# Patient Record
Sex: Male | Born: 1965 | Race: White | Hispanic: No | Marital: Married | Smoking: Current every day smoker
Health system: Southern US, Community
[De-identification: ages and names within clinical notes are randomized; demographics above are authoritative.]

## PROBLEM LIST (undated history)

## (undated) DIAGNOSIS — K219 Gastro-esophageal reflux disease without esophagitis: Secondary | ICD-10-CM

## (undated) DIAGNOSIS — E785 Hyperlipidemia, unspecified: Secondary | ICD-10-CM

## (undated) DIAGNOSIS — G4730 Sleep apnea, unspecified: Secondary | ICD-10-CM

## (undated) DIAGNOSIS — G6181 Chronic inflammatory demyelinating polyneuritis: Secondary | ICD-10-CM

## (undated) DIAGNOSIS — F419 Anxiety disorder, unspecified: Secondary | ICD-10-CM

## (undated) DIAGNOSIS — J45909 Unspecified asthma, uncomplicated: Secondary | ICD-10-CM

## (undated) DIAGNOSIS — R269 Unspecified abnormalities of gait and mobility: Secondary | ICD-10-CM

## (undated) DIAGNOSIS — F329 Major depressive disorder, single episode, unspecified: Secondary | ICD-10-CM

## (undated) DIAGNOSIS — F32A Depression, unspecified: Secondary | ICD-10-CM

## (undated) DIAGNOSIS — R2689 Other abnormalities of gait and mobility: Secondary | ICD-10-CM

## (undated) HISTORY — DX: Unspecified abnormalities of gait and mobility: R26.9

## (undated) HISTORY — DX: Anxiety disorder, unspecified: F41.9

## (undated) HISTORY — DX: Major depressive disorder, single episode, unspecified: F32.9

## (undated) HISTORY — DX: Other abnormalities of gait and mobility: R26.89

## (undated) HISTORY — DX: Chronic inflammatory demyelinating polyneuritis: G61.81

## (undated) HISTORY — DX: Hyperlipidemia, unspecified: E78.5

## (undated) HISTORY — DX: Depression, unspecified: F32.A

## (undated) HISTORY — DX: Unspecified asthma, uncomplicated: J45.909

## (undated) HISTORY — PX: FRACTURE SURGERY: SHX138

## (undated) HISTORY — DX: Sleep apnea, unspecified: G47.30

## (undated) HISTORY — DX: Gastro-esophageal reflux disease without esophagitis: K21.9

---

## 2017-07-27 ENCOUNTER — Ambulatory Visit: Payer: 59 | Admitting: Family Medicine

## 2017-07-27 ENCOUNTER — Encounter: Payer: Self-pay | Admitting: Family Medicine

## 2017-07-27 VITALS — BP 136/84 | HR 74 | Temp 98.4°F | Ht 70.0 in | Wt 181.0 lb

## 2017-07-27 DIAGNOSIS — I10 Essential (primary) hypertension: Secondary | ICD-10-CM | POA: Diagnosis not present

## 2017-07-27 DIAGNOSIS — K219 Gastro-esophageal reflux disease without esophagitis: Secondary | ICD-10-CM

## 2017-07-27 DIAGNOSIS — Z23 Encounter for immunization: Secondary | ICD-10-CM

## 2017-07-27 DIAGNOSIS — F339 Major depressive disorder, recurrent, unspecified: Secondary | ICD-10-CM | POA: Diagnosis not present

## 2017-07-27 DIAGNOSIS — E785 Hyperlipidemia, unspecified: Secondary | ICD-10-CM | POA: Diagnosis not present

## 2017-07-27 DIAGNOSIS — F17200 Nicotine dependence, unspecified, uncomplicated: Secondary | ICD-10-CM

## 2017-07-27 DIAGNOSIS — F17210 Nicotine dependence, cigarettes, uncomplicated: Secondary | ICD-10-CM | POA: Insufficient documentation

## 2017-07-27 DIAGNOSIS — I209 Angina pectoris, unspecified: Secondary | ICD-10-CM | POA: Diagnosis not present

## 2017-07-27 MED ORDER — ATORVASTATIN CALCIUM 20 MG PO TABS: 20.0000 mg | ORAL_TABLET | Freq: Every day | ORAL | 5 refills | Status: DC

## 2017-07-27 MED ORDER — NAPROXEN 500 MG PO TABS: 500.0000 mg | ORAL_TABLET | Freq: Two times a day (BID) | ORAL | 2 refills | Status: DC

## 2017-07-27 MED ORDER — HYDROCHLOROTHIAZIDE 25 MG PO TABS: 25.0000 mg | ORAL_TABLET | Freq: Every day | ORAL | 5 refills | Status: DC

## 2017-07-27 MED ORDER — OMEPRAZOLE 20 MG PO CPDR: 20.0000 mg | DELAYED_RELEASE_CAPSULE | Freq: Every day | ORAL | 11 refills | Status: DC

## 2017-07-27 MED ORDER — CETIRIZINE HCL 10 MG PO TABS: 10.0000 mg | ORAL_TABLET | Freq: Every day | ORAL | 11 refills | Status: DC

## 2017-07-27 MED ORDER — FLUTICASONE PROPIONATE 50 MCG/ACT NA SUSP: 1.0000 | Freq: Two times a day (BID) | NASAL | 6 refills | Status: AC | PRN
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
## 2017-07-28 LAB — CMP14+EGFR
A/G RATIO: 1.4 (ref 1.2–2.2)
ALT: 16 IU/L (ref 0–44)
AST: 17 IU/L (ref 0–40)
Albumin: 3.8 g/dL (ref 3.5–5.5)
Alkaline Phosphatase: 90 IU/L (ref 39–117)
BILIRUBIN TOTAL: 0.2 mg/dL (ref 0.0–1.2)
BUN / CREAT RATIO: 10 (ref 9–20)
BUN: 11 mg/dL (ref 6–24)
CHLORIDE: 99 mmol/L (ref 96–106)
CO2: 26 mmol/L (ref 20–29)
Calcium: 9.1 mg/dL (ref 8.7–10.2)
Creatinine, Ser: 1.11 mg/dL (ref 0.76–1.27)
GFR calc Af Amer: 87 mL/min/{1.73_m2} (ref >59)
GFR, EST NON AFRICAN AMERICAN: 75 mL/min/{1.73_m2} (ref >59)
Globulin, Total: 2.7 g/dL (ref 1.5–4.5)
Glucose: 79 mg/dL (ref 65–99)
POTASSIUM: 4.5 mmol/L (ref 3.5–5.2)
Sodium: 138 mmol/L (ref 134–144)
TOTAL PROTEIN: 6.5 g/dL (ref 6.0–8.5)

## 2017-07-28 LAB — CBC WITH DIFFERENTIAL/PLATELET
BASOS: 0 %
Basophils Absolute: 0 10*3/uL (ref 0.0–0.2)
EOS (ABSOLUTE): 0.4 10*3/uL (ref 0.0–0.4)
Eos: 5 %
HEMOGLOBIN: 12.9 g/dL — AB (ref 13.0–17.7)
Hematocrit: 38 % (ref 37.5–51.0)
IMMATURE GRANS (ABS): 0 10*3/uL (ref 0.0–0.1)
Immature Granulocytes: 0 %
LYMPHS: 20 %
Lymphocytes Absolute: 1.5 10*3/uL (ref 0.7–3.1)
MCH: 30.9 pg (ref 26.6–33.0)
MCHC: 33.9 g/dL (ref 31.5–35.7)
MCV: 91 fL (ref 79–97)
MONOCYTES: 4 %
Monocytes Absolute: 0.3 10*3/uL (ref 0.1–0.9)
NEUTROS ABS: 5.3 10*3/uL (ref 1.4–7.0)
Neutrophils: 71 %
Platelets: 311 10*3/uL (ref 150–379)
RBC: 4.18 x10E6/uL (ref 4.14–5.80)
RDW: 14.1 % (ref 12.3–15.4)
WBC: 7.5 10*3/uL (ref 3.4–10.8)

## 2017-07-28 LAB — LIPID PANEL
CHOL/HDL RATIO: 3.7 ratio (ref 0.0–5.0)
Cholesterol, Total: 119 mg/dL (ref 100–199)
HDL: 32 mg/dL — ABNORMAL LOW (ref >39)
LDL Calculated: 68 mg/dL (ref 0–99)
Triglycerides: 93 mg/dL (ref 0–149)
VLDL CHOLESTEROL CAL: 19 mg/dL (ref 5–40)

## 2017-09-07 ENCOUNTER — Other Ambulatory Visit: Payer: Self-pay | Admitting: *Deleted

## 2017-09-07 MED ORDER — VERAPAMIL HCL ER 240 MG PO CP24: 240.0000 mg | ORAL_CAPSULE | Freq: Every morning | ORAL | 0 refills | Status: DC

## 2017-09-12 ENCOUNTER — Encounter: Payer: Self-pay | Admitting: Cardiology
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
## 2017-09-14 ENCOUNTER — Ambulatory Visit: Payer: 59 | Admitting: Cardiology

## 2017-09-14 ENCOUNTER — Encounter: Payer: Self-pay | Admitting: Cardiology

## 2017-09-14 VITALS — BP 123/82 | HR 75 | Ht 70.0 in | Wt 177.2 lb

## 2017-09-14 DIAGNOSIS — Z720 Tobacco use: Secondary | ICD-10-CM | POA: Diagnosis not present

## 2017-09-14 DIAGNOSIS — E785 Hyperlipidemia, unspecified: Secondary | ICD-10-CM

## 2017-09-14 DIAGNOSIS — R072 Precordial pain: Secondary | ICD-10-CM

## 2017-09-14 MED ORDER — NITROGLYCERIN 0.4 MG SL SUBL: 0.4000 mg | SUBLINGUAL_TABLET | SUBLINGUAL | 2 refills | Status: DC | PRN

## 2017-09-14 NOTE — Patient Instructions (Signed)
Medication Instructions:  Continue current medications  If you need a refill on your cardiac medications before your next appointment, please call your pharmacy.  Labwork: None Ordered   Testing/Procedures: Your physician has requested that you have an exercise tolerance test. For further information please visit [email or web address]. Please also follow instruction sheet, as given.   Follow-Up: Your physician wants you to follow-up in: As Needed.     Thank you for choosing [facility]!!       

## 2017-09-30 ENCOUNTER — Telehealth (HOSPITAL_COMMUNITY): Payer: Self-pay

## 2017-09-30 NOTE — Telephone Encounter (Signed)
Encounter complete. 

## 2017-10-01 ENCOUNTER — Ambulatory Visit (HOSPITAL_COMMUNITY): Admission: RE | Admit: 2017-10-01 | Payer: 59 | Source: Ambulatory Visit | Attending: Cardiology | Admitting: Cardiology

## 2017-10-02 ENCOUNTER — Other Ambulatory Visit: Payer: Self-pay | Admitting: Family Medicine

## 2017-10-02 MED ORDER — ESCITALOPRAM OXALATE 10 MG PO TABS: 10.0000 mg | ORAL_TABLET | Freq: Every day | ORAL | 1 refills | Status: DC

## 2017-10-02 NOTE — Telephone Encounter (Signed)
Pt notified of refill Okay per Dr Louanne Skyeettinger

## 2017-10-05 ENCOUNTER — Ambulatory Visit: Payer: 59 | Admitting: Family Medicine

## 2017-10-05 ENCOUNTER — Encounter: Payer: Self-pay | Admitting: Family Medicine

## 2017-10-05 VITALS — BP 122/77 | HR 78 | Temp 97.5°F | Ht 70.0 in | Wt 175.0 lb

## 2017-10-05 DIAGNOSIS — R399 Unspecified symptoms and signs involving the genitourinary system: Secondary | ICD-10-CM

## 2017-10-05 DIAGNOSIS — R319 Hematuria, unspecified: Secondary | ICD-10-CM

## 2017-10-05 LAB — MICROSCOPIC EXAMINATION
RBC, UA: NONE SEEN /hpf (ref 0–2)
RENAL EPITHEL UA: NONE SEEN /HPF
WBC, UA: NONE SEEN /hpf (ref 0–5)

## 2017-10-05 LAB — URINALYSIS, COMPLETE
Bilirubin, UA: NEGATIVE
Glucose, UA: NEGATIVE
Ketones, UA: NEGATIVE
LEUKOCYTES UA: NEGATIVE
Nitrite, UA: NEGATIVE
PH UA: 5.5 (ref 5.0–7.5)
PROTEIN UA: NEGATIVE
RBC, UA: NEGATIVE
Specific Gravity, UA: 1.025 (ref 1.005–1.030)
Urobilinogen, Ur: 1 mg/dL (ref 0.2–1.0)

## 2017-10-05 MED ORDER — CEPHALEXIN 500 MG PO CAPS: 500.0000 mg | ORAL_CAPSULE | Freq: Four times a day (QID) | ORAL | 0 refills | Status: DC

## 2017-10-05 NOTE — Progress Notes (Signed)
BP 122/77   Pulse 78   Temp (!) 97.5 F (36.4 C) (Oral)   Ht 5\' 10"  (1.778 m)   Wt 175 lb (79.4 kg)   BMI 25.11 kg/m    Subjective:    Patient ID: Anthony Booth, male    DOB: 04/28/1965, 52 y.o.   MRN: [id number]  HPI: Anthony Booth is a 52 y.o. male presenting on 10/05/2017 for Hematuria (noticed once this weekend)   HPI Hematuria and dysuria Patient is coming with complaints of hematuria and dysuria that he noticed this weekend.  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  He does admit that he does not drink enough fluids and does have some darker urine all the time but has not noticed blood before that time.  Since then he is only noticed a couple drops coming out one other time but he has had burning and dysuria and pain with urination over this whole weekend including 2 today.  He denies any flank pain or abdominal pain or fevers or chills.  Relevant past medical, surgical, family and social history reviewed and updated as indicated. Interim medical history since our last visit reviewed. Allergies and medications reviewed and updated.  Review of Systems  Constitutional: Negative for chills and fever.  Respiratory: Negative for shortness of breath and wheezing.   Cardiovascular: Negative for chest pain and leg swelling.  Gastrointestinal: Negative for abdominal pain.  Genitourinary: Positive for dysuria, hematuria and urgency. Negative for difficulty urinating, flank pain, frequency, genital sores, penile pain, penile swelling, scrotal swelling and testicular pain.  Musculoskeletal: Negative for back pain and gait problem.  Skin: Negative for rash.  All other systems reviewed and are negative.   Per HPI unless specifically indicated above   Allergies as of 10/05/2017      Reactions   Penicillins Nausea Only    Lovenox [enoxaparin Sodium] Anxiety      Medication List        Accurate as of 10/05/17  4:38 PM. Always use your most recent med list.          albuterol 108 (90 Base) MCG/ACT inhaler Commonly known as:  PROVENTIL HFA;VENTOLIN HFA Inhale 2 puffs into the lungs every 6 (six) hours as needed for wheezing or shortness of breath.   aspirin EC 81 MG tablet Take 81 mg by mouth daily.   atorvastatin 20 MG tablet Commonly known as:  LIPITOR Take 1 tablet (20 mg total) by mouth daily.   baclofen 10 MG tablet Commonly known as:  LIORESAL Take 10 mg by mouth 2 (two) times daily.   cephALEXin 500 MG capsule Commonly known as:  KEFLEX Take 1 capsule (500 mg total) by mouth 4 (four) times daily.   cetirizine 10 MG tablet Commonly known as:  ZYRTEC Take 1 tablet (10 mg total) by mouth daily.   escitalopram 10 MG tablet Commonly known as:  LEXAPRO Take 1 tablet (10 mg total) by mouth daily.   fluticasone 50 MCG/ACT nasal spray Commonly known as:  FLONASE Place 1 spray into both nostrils 2 (two) times daily as needed for allergies or rhinitis.   hydrochlorothiazide 25 MG tablet Commonly known as:  HYDRODIURIL Take 1 tablet (25 mg total) by mouth daily.   naproxen 500 MG tablet Commonly known as:  NAPROSYN Take 1  tablet (500 mg total) by mouth 2 (two) times daily with a meal.   nitroGLYCERIN 0.4 MG SL tablet Commonly known as:  NITROSTAT Place 1 tablet (0.4 mg total) under the tongue every 5 (five) minutes as needed for chest pain.   omeprazole 20 MG capsule Commonly known as:  PRILOSEC Take 1 capsule (20 mg total) by mouth daily.   verapamil 240 MG 24 hr capsule Commonly known as:  VERELAN PM Take 1 capsule (240 mg total) by mouth every morning.   vitamin C 500 MG tablet Commonly known as:  ASCORBIC ACID Take 500 mg by mouth daily.   Vitamin D3 5000 units Tabs Take 5,000 Units by mouth daily.          Objective:    BP 122/77   Pulse 78   Temp (!) 97.5 F  (36.4 C) (Oral)   Ht 5\' 10"  (1.778 m)   Wt 175 lb (79.4 kg)   BMI 25.11 kg/m   Wt Readings from Last 3 Encounters:  10/05/17 175 lb (79.4 kg)  09/14/17 177 lb 3.2 oz (80.4 kg)  07/27/17 181 lb (82.1 kg)    Physical Exam  Constitutional: He is oriented to person, place, and time. He appears well-developed and well-nourished. No distress.  Eyes: Conjunctivae are normal. No scleral icterus.  Cardiovascular: Normal rate, regular rhythm, normal heart sounds and intact distal pulses.  No murmur heard. Pulmonary/Chest: Effort normal and breath sounds normal. No respiratory distress. He has no wheezes.  Abdominal: Soft. Bowel sounds are normal. He exhibits no distension and no mass. There is no tenderness. There is no rigidity, no rebound, no guarding and no CVA tenderness.  Neurological: He is alert and oriented to person, place, and time. Coordination normal.  Skin: Skin is warm and dry. No rash noted. He is not diaphoretic.  Psychiatric: He has a normal mood and affect. His behavior is normal.  Nursing note and vitals reviewed.  Urinalysis: 0-10 epithelial cells, few bacteria, otherwise negative     Assessment & Plan:   Problem List Items Addressed This Visit    None    Visit Diagnoses    UTI symptoms    -  Primary   Relevant Medications   cephALEXin (KEFLEX) 500 MG capsule   Hematuria, unspecified type       Relevant Medications   cephALEXin (KEFLEX) 500 MG capsule   Other Relevant Orders   Urinalysis, Complete       Follow up plan: Return if symptoms worsen or fail to improve.  Counseling provided for all of the vaccine components Orders Placed This Encounter  Procedures  . Urinalysis, Complete    Arville CareJoshua Dettinger, MD [facility] 10/05/2017, 4:38 PM

## 2017-12-08 ENCOUNTER — Other Ambulatory Visit: Payer: Self-pay | Admitting: *Deleted

## 2017-12-08 MED ORDER — BACLOFEN 10 MG PO TABS: 10.0000 mg | ORAL_TABLET | Freq: Two times a day (BID) | ORAL | 1 refills | Status: DC

## 2017-12-08 NOTE — Telephone Encounter (Signed)
Pt requesting RF on Baclofen for his pain - shoulder, chest Rxd by previous provider, not filled here yet First OV 07/27/17. Next OV 01/27/18 [facility] [place] Please advise

## 2017-12-12 ENCOUNTER — Other Ambulatory Visit: Payer: Self-pay | Admitting: Family Medicine

## 2018-01-27 ENCOUNTER — Ambulatory Visit: Payer: 59 | Admitting: Family Medicine

## 2018-01-27 ENCOUNTER — Encounter: Payer: Self-pay | Admitting: Family Medicine

## 2018-01-27 MED ORDER — NAPROXEN 500 MG PO TABS: 500.0000 mg | ORAL_TABLET | Freq: Two times a day (BID) | ORAL | 2 refills | Status: DC

## 2018-01-27 MED ORDER — VERAPAMIL HCL ER 240 MG PO CP24: ORAL_CAPSULE | ORAL | 3 refills | Status: AC

## 2018-01-27 MED ORDER — BACLOFEN 10 MG PO TABS: 10.0000 mg | ORAL_TABLET | Freq: Two times a day (BID) | ORAL | 1 refills | Status: DC

## 2018-01-27 MED ORDER — ATORVASTATIN CALCIUM 20 MG PO TABS: 20.0000 mg | ORAL_TABLET | Freq: Every day | ORAL | 3 refills | Status: DC

## 2018-01-27 MED ORDER — HYDROCHLOROTHIAZIDE 25 MG PO TABS: 25.0000 mg | ORAL_TABLET | Freq: Every day | ORAL | 3 refills | Status: DC

## 2018-01-27 NOTE — Progress Notes (Signed)
Erroneous visit because patient had to leave with spouse to go to the emergency department

## 2018-03-08 ENCOUNTER — Encounter: Payer: Self-pay | Admitting: Family Medicine

## 2018-03-08 ENCOUNTER — Ambulatory Visit (INDEPENDENT_AMBULATORY_CARE_PROVIDER_SITE_OTHER): Payer: 59 | Admitting: Family Medicine

## 2018-03-08 VITALS — BP 118/70 | HR 84 | Temp 98.3°F | Ht 70.0 in | Wt 168.2 lb

## 2018-03-08 DIAGNOSIS — L858 Other specified epidermal thickening: Secondary | ICD-10-CM | POA: Diagnosis not present

## 2018-03-08 DIAGNOSIS — K219 Gastro-esophageal reflux disease without esophagitis: Secondary | ICD-10-CM | POA: Diagnosis not present

## 2018-03-08 DIAGNOSIS — E785 Hyperlipidemia, unspecified: Secondary | ICD-10-CM

## 2018-03-08 DIAGNOSIS — I10 Essential (primary) hypertension: Secondary | ICD-10-CM | POA: Diagnosis not present

## 2018-03-08 NOTE — Progress Notes (Signed)
BP 118/70   Pulse 84   Temp 98.3 F (36.8 C) (Oral)   Ht _0  (1.778 m)   Wt 168 lb 3.2 oz (76.3 kg)   BMI 24.13 kg/m    Subjective:    Patient ID: Anthony Booth, male    DOB: 1965-10-17, 52 y.o.   MRN: [id number]  HPI: Anthony Booth is a 52 y.o. male presenting on 03/08/2018 for Hyperlipidemia (6 month follow up) and Hypertension   HPI Hypertension Patient is currently on verapamil and hydrochlorothiazide, and their blood pressure today is 118/70. Patient denies any lightheadedness or dizziness. Patient denies headaches, blurred vision, chest pains, shortness of breath, or weakness. Denies any side effects from medication and is content with current medication.   Hyperlipidemia Patient is coming in for recheck of his hyperlipidemia. The patient is currently taking atorvastatin. They deny any issues with myalgias or history of liver damage from it. They deny any focal numbness or weakness or chest pain.   GERD Patient is currently on omeprazole.  She denies any major symptoms or abdominal pain or belching or burping. She denies any blood in her stool or lightheadedness or dizziness.   Skin lesion/horn Patient has a skin lesion that is noticed over the past 6 months that sticks out like a horn on his right chest and it really bothers him because of the way that it is pointed in he picks himself sometimes on it because it is sharp and hard and is like a horn.  Relevant past medical, surgical, family and social history reviewed and updated as indicated. Interim medical history since our last visit reviewed. Allergies and medications reviewed and updated.  Review of Systems  Constitutional: Negative for chills and fever.  Eyes: Negative for visual disturbance.  Respiratory: Negative for shortness of breath and wheezing.   Cardiovascular: Negative for chest pain and leg swelling.  Musculoskeletal: Negative for back pain and gait problem.  Skin: Negative for rash.   Horn on chest  Neurological: Negative for dizziness, weakness and numbness.  All other systems reviewed and are negative.   Per HPI unless specifically indicated above   Allergies as of 03/08/2018      Reactions   Penicillins Nausea Only   Lovenox [enoxaparin Sodium] Anxiety      Medication List       Accurate as of March 08, 2018  9:03 AM. Always use your most recent med list.        albuterol 108 (90 Base) MCG/ACT inhaler Commonly known as:  PROVENTIL HFA;VENTOLIN HFA Inhale 2 puffs into the lungs every 6 (six) hours as needed for wheezing or shortness of breath.   aspirin EC 81 MG tablet Take 81 mg by mouth daily.   atorvastatin 20 MG tablet Commonly known as:  LIPITOR Take 1 tablet (20 mg total) by mouth daily.   baclofen 10 MG tablet Commonly known as:  LIORESAL Take 1 tablet (10 mg total) by mouth 2 (two) times daily.   cetirizine 10 MG tablet Commonly known as:  ZYRTEC Take 1 tablet (10 mg total) by mouth daily.   escitalopram 10 MG tablet Commonly known as:  LEXAPRO Take 1 tablet (10 mg total) by mouth daily.   fluticasone 50 MCG/ACT nasal spray Commonly known as:  FLONASE Place 1 spray into both nostrils 2 (two) times daily as needed for allergies or rhinitis.   hydrochlorothiazide 25 MG tablet Commonly known as:  HYDRODIURIL Take 1 tablet (25 mg total) by mouth daily.  naproxen 500 MG tablet Commonly known as:  NAPROSYN Take 1 tablet (500 mg total) by mouth 2 (two) times daily with a meal.   nitroGLYCERIN 0.4 MG SL tablet Commonly known as:  NITROSTAT Place 1 tablet (0.4 mg total) under the tongue every 5 (five) minutes as needed for chest pain.   omeprazole 20 MG capsule Commonly known as:  PRILOSEC Take 1 capsule (20 mg total) by mouth daily.   verapamil 240 MG 24 hr capsule Commonly known as:  VERELAN PM TAKE 1 CAPSULE BY MOUTH IN THE MORNING   vitamin C 500 MG tablet Commonly known as:  ASCORBIC ACID Take 500 mg by mouth daily.    Vitamin D3 125 MCG (5000 UT) Tabs Take 5,000 Units by mouth daily.          Objective:    BP 118/70   Pulse 84   Temp 98.3 F (36.8 C) (Oral)   Ht _0  (1.778 m)   Wt 168 lb 3.2 oz (76.3 kg)   BMI 24.13 kg/m   Wt Readings from Last 3 Encounters:  03/08/18 168 lb 3.2 oz (76.3 kg)  01/27/18 170 lb (77.1 kg)  10/05/17 175 lb (79.4 kg)    Physical Exam Vitals signs and nursing note reviewed.  Constitutional:      General: He is not in acute distress.    Appearance: He is well-developed. He is not diaphoretic.  Eyes:     General: No scleral icterus.    Conjunctiva/sclera: Conjunctivae normal.  Neck:     Musculoskeletal: Neck supple.     Thyroid: No thyromegaly.  Cardiovascular:     Rate and Rhythm: Normal rate and regular rhythm.     Heart sounds: Normal heart sounds. No murmur.  Pulmonary:     Effort: Pulmonary effort is normal. No respiratory distress.     Breath sounds: Normal breath sounds. No wheezing.  Musculoskeletal: Normal range of motion.  Lymphadenopathy:     Cervical: No cervical adenopathy.  Skin:    General: Skin is warm and dry.     Findings: No rash.     Comments: Cutaneous horn on right anterior chest that about three quarters of a centimeter elevation above the skin it is very spindle-like and thin.  Neurological:     Mental Status: He is alert and oriented to person, place, and time.     Coordination: Coordination normal.  Psychiatric:        Behavior: Behavior normal.         Assessment & Plan:   Problem List Items Addressed This Visit      Cardiovascular and Mediastinum   Hypertension - Primary   Relevant Orders   CMP14+EGFR     Digestive   GERD (gastroesophageal reflux disease)     Other   Hyperlipidemia LDL goal <130   Relevant Orders   Lipid panel    Other Visit Diagnoses    Cutaneous horn       Right anterior chest, will schedule back for removal      Follow up plan: Return in about 6 months (around 09/07/2018), or  if symptoms worsen or fail to improve, for 6 months for normal appointment, schedule back for skin lesion removal biopsy as soon as he wants to.  Counseling provided for all of the vaccine components Orders Placed This Encounter  Procedures  . CMP14+EGFR  . Lipid panel    Caryl Pina, MD [facility] 03/08/2018, 9:03 AM

## 2018-03-09 LAB — CMP14+EGFR
ALBUMIN: 4.3 g/dL (ref 3.5–5.5)
ALK PHOS: 77 IU/L (ref 39–117)
ALT: 14 IU/L (ref 0–44)
AST: 19 IU/L (ref 0–40)
Albumin/Globulin Ratio: 1.7 (ref 1.2–2.2)
BUN / CREAT RATIO: 24 — AB (ref 9–20)
BUN: 24 mg/dL (ref 6–24)
Bilirubin Total: 0.5 mg/dL (ref 0.0–1.2)
CALCIUM: 9.7 mg/dL (ref 8.7–10.2)
CO2: 24 mmol/L (ref 20–29)
CREATININE: 1 mg/dL (ref 0.76–1.27)
Chloride: 98 mmol/L (ref 96–106)
GFR calc Af Amer: 99 mL/min/{1.73_m2} (ref >59)
GFR calc non Af Amer: 86 mL/min/{1.73_m2} (ref >59)
GLUCOSE: 72 mg/dL (ref 65–99)
Globulin, Total: 2.6 g/dL (ref 1.5–4.5)
Potassium: 4.4 mmol/L (ref 3.5–5.2)
Sodium: 138 mmol/L (ref 134–144)
TOTAL PROTEIN: 6.9 g/dL (ref 6.0–8.5)

## 2018-03-09 LAB — LIPID PANEL
CHOLESTEROL TOTAL: 143 mg/dL (ref 100–199)
Chol/HDL Ratio: 4 ratio (ref 0.0–5.0)
HDL: 36 mg/dL — ABNORMAL LOW (ref >39)
LDL Calculated: 76 mg/dL (ref 0–99)
Triglycerides: 154 mg/dL — ABNORMAL HIGH (ref 0–149)
VLDL Cholesterol Cal: 31 mg/dL (ref 5–40)

## 2018-04-08 ENCOUNTER — Encounter: Payer: Self-pay | Admitting: Family Medicine

## 2018-04-08 ENCOUNTER — Ambulatory Visit: Payer: 59 | Admitting: Family Medicine

## 2018-04-08 VITALS — BP 120/80 | HR 76 | Temp 98.5°F | Ht 70.0 in | Wt 164.8 lb

## 2018-04-08 DIAGNOSIS — L858 Other specified epidermal thickening: Secondary | ICD-10-CM | POA: Diagnosis not present

## 2018-04-08 DIAGNOSIS — B078 Other viral warts: Secondary | ICD-10-CM | POA: Diagnosis not present

## 2018-04-08 MED ORDER — ESCITALOPRAM OXALATE 10 MG PO TABS: 10.0000 mg | ORAL_TABLET | Freq: Every day | ORAL | 3 refills | Status: AC

## 2018-04-08 NOTE — Progress Notes (Signed)
   BP 120/80   Pulse 76   Temp 98.5 F (36.9 C) (Oral)   Ht 5\' 10"  (1.778 m)   Wt 164 lb 12.8 oz (74.8 kg)   BMI 23.65 kg/m    Subjective:    Patient ID: Anthony Booth, male    DOB: [birth year], 53 y.o.   MRN: [id number]  HPI: Anthony Booth is a 53 y.o. male presenting on 04/08/2018 for removing cutaneous horn   HPI Skin spot removal, horn on right upper chest.  He thinks he has had it for possibly a year but is really started noticing it over the past few months when it became taller.  He says that the hard corn on his right anterior chest  Relevant past medical, surgical, family and social history reviewed and updated as indicated. Interim medical history since our last visit reviewed. Allergies and medications reviewed and updated.  Review of Systems  Constitutional: Negative for chills and fever.  Skin: Negative for rash.  All other systems reviewed and are negative.   Per HPI unless specifically indicated above        Objective:    BP 120/80   Pulse 76   Temp 98.5 F (36.9 C) (Oral)   Ht 5\' 10"  (1.778 m)   Wt 164 lb 12.8 oz (74.8 kg)   BMI 23.65 kg/m   Wt Readings from Last 3 Encounters:  04/08/18 164 lb 12.8 oz (74.8 kg)  03/08/18 168 lb 3.2 oz (76.3 kg)  01/27/18 170 lb (77.1 kg)    Physical Exam Vitals signs and nursing note reviewed.  Constitutional:      General: He is not in acute distress.    Appearance: He is well-developed. He is not diaphoretic.  Neck:     Thyroid: No thyromegaly.  Skin:    General: Skin is warm and dry.     Findings: Lesion present. No rash.       Neurological:     Mental Status: He is alert and oriented to person, place, and time.     Coordination: Coordination normal.  Psychiatric:        Behavior: Behavior normal.     Skin lesion removal: Verbal consent was obtained.  Betadine was used for cleansing.  2cc of 2% lidocaine without epinephrine was used for anesthesia.  Shave biopsy was performed with good  margins.  Silver nitrate was used to achieve hemostasis.  Topical antibiotic was used and then it was covered by 3x 3 and bandage told in place. Procedure was tolerated well.  Bleeding was minimal    Assessment & Plan:   Problem List Items Addressed This Visit    None    Visit Diagnoses    Cutaneous horn    -  Primary   Relevant Orders   Pathology       Follow up plan: Return if symptoms worsen or fail to improve.  Counseling provided for all of the vaccine components No orders of the defined types were placed in this encounter.   Arville Care, MD [facility] 04/08/2018, 8:29 AM

## 2018-04-12 LAB — PATHOLOGY

## 2018-05-26 ENCOUNTER — Ambulatory Visit: Payer: 59 | Admitting: Family Medicine

## 2018-05-26 ENCOUNTER — Encounter: Payer: Self-pay | Admitting: Family Medicine

## 2018-05-26 VITALS — BP 120/80 | HR 69 | Temp 97.8°F | Ht 70.0 in | Wt 163.4 lb

## 2018-05-26 DIAGNOSIS — J0110 Acute frontal sinusitis, unspecified: Secondary | ICD-10-CM

## 2018-05-26 DIAGNOSIS — R202 Paresthesia of skin: Secondary | ICD-10-CM | POA: Diagnosis not present

## 2018-05-26 DIAGNOSIS — R200 Anesthesia of skin: Secondary | ICD-10-CM | POA: Diagnosis not present

## 2018-05-26 MED ORDER — CEFDINIR 300 MG PO CAPS: 300.0000 mg | ORAL_CAPSULE | Freq: Two times a day (BID) | ORAL | 0 refills | Status: DC

## 2018-05-26 NOTE — Progress Notes (Signed)
BP 120/80   Pulse 69   Temp 97.8 F (36.6 C) (Oral)   Ht 5\' 10"  (1.778 m)   Wt 74.1 kg   BMI 23.45 kg/m    Subjective:    Patient ID: Anthony Booth, male    DOB: 1965/06/09, 53 y.o.   MRN: [id number]  HPI: Anthony Booth is a 53 y.o. male presenting on 05/26/2018 for dry throat (x 2-3 weeks ago- patient states that it is also affecting his speach); sailva build up; and Numbness (bilateral hands - patient states he will wake up with his bilateral hands numb and tinggling)   HPI  Dry Throat and Excess Salivation Pt is a 53 y/o male presenting for 2-3 weeks of excessive salivation and dry, scratchy throat. He says he feels that saliva is building up in his mouth, and notes that holding the saliva is in his mouth is making his speech a bit slurred.  He also notes some drooling. Pt states his throat is dry and sore, but has not had any difficulty swallowing. He says that his symptoms are constant, and that nothing makes them better or worse.  He denies abdominal pain or gastric reflux. He denies nausea, vomiting, and diarrhea, and has had no changes in medications or diet.   Tingling in Hands Pt also complains of about 2 weeks of tingling in his hands when he wakes up in the morning.  He states he mainly feels this in his right hand, but occasionally in his left as well. Pt states the tingling is only present when he wakes up, and lasts less than 10 minutes. He states the tingling goes from his palm up through all of his fingers, and has not noticed that it is isolated to certain fingers.  He denies pain or injury to his hand.  Relevant past medical, surgical, family and social history reviewed and updated as indicated. Interim medical history since our last visit reviewed. Allergies and medications reviewed and updated.  Review of Systems  Constitutional: Negative for chills and fever.  HENT: Positive for drooling and sore throat. Negative for congestion, ear pain, mouth sores, sinus  pressure, sinus pain and trouble swallowing.   Respiratory: Negative for shortness of breath.   Cardiovascular: Negative for chest pain.  Gastrointestinal: Negative for abdominal pain, diarrhea, nausea and vomiting.  Musculoskeletal: Negative for neck pain.  Neurological: Positive for numbness. Negative for headaches.    Per HPI unless specifically indicated above      Objective:    BP 120/80   Pulse 69   Temp 97.8 F (36.6 C) (Oral)   Ht 5\' 10"  (1.778 m)   Wt 74.1 kg   BMI 23.45 kg/m   Wt Readings from Last 3 Encounters:  05/26/18 74.1 kg  04/08/18 74.8 kg  03/08/18 76.3 kg    Physical Exam Constitutional:      General: He is not in acute distress.    Appearance: Normal appearance.  HENT:     Right Ear: Tympanic membrane normal.     Left Ear: Tympanic membrane normal.     Mouth/Throat:     Mouth: Mucous membranes are moist.     Pharynx: No oropharyngeal exudate or posterior oropharyngeal erythema.  Eyes:     Conjunctiva/sclera: Conjunctivae normal.     Pupils: Pupils are equal, round, and reactive to light.  Neck:     Musculoskeletal: No muscular tenderness.  Cardiovascular:     Rate and Rhythm: Normal rate and regular rhythm.  Heart sounds: Normal heart sounds. No murmur.  Pulmonary:     Effort: Pulmonary effort is normal. No respiratory distress.     Breath sounds: Normal breath sounds. No wheezing.  Musculoskeletal:        General: No swelling, tenderness or deformity.     Right wrist: He exhibits normal range of motion, no tenderness and no swelling.       Arms:  Lymphadenopathy:     Cervical: No cervical adenopathy.  Neurological:     Mental Status: He is alert.         Assessment & Plan:   Problem List Items Addressed This Visit    None    Visit Diagnoses    Acute non-recurrent frontal sinusitis    -  Primary   Relevant Medications   cefdinir (OMNICEF) 300 MG capsule   Numbness and tingling       Mainly in right hand when he wakes up,  unable to elicit today, will monitor for now    Hand and wrist tingling, possible carpal tunnel: - Recommended pt try wearing a wrist brace at night, and see if this helps the tingling. If symptoms continue, will investigate further.  Sinusitis:  - Pt most likely has acute sinusitis. Will prescribe Cefdinir and recommended pt follow up if symptoms do not improve.  Follow up plan: Return if symptoms worsen or fail to improve.  Counseling provided for all of the vaccine components No orders of the defined types were placed in this encounter.   Jodelle Gross PA-S [facility] I was personally present for all components of the history, physical exam and/or medical decision making.  I agree with the documentation performed by the PA student and agree with assessment and plan above.  PA student was Jodelle Gross. Arville Care, MD [facility] 05/26/2018, 4:56 PM

## 2018-05-31 ENCOUNTER — Ambulatory Visit (HOSPITAL_COMMUNITY)
Admission: RE | Admit: 2018-05-31 | Discharge: 2018-05-31 | Disposition: A | Discharge status: Home / Self Care | Payer: 59 | Source: Ambulatory Visit | Attending: Family Medicine | Admitting: Family Medicine

## 2018-05-31 ENCOUNTER — Telehealth: Payer: Self-pay | Admitting: Family Medicine

## 2018-05-31 ENCOUNTER — Telehealth: Payer: Self-pay | Admitting: *Deleted

## 2018-05-31 DIAGNOSIS — R4781 Slurred speech: Secondary | ICD-10-CM | POA: Diagnosis not present

## 2018-05-31 DIAGNOSIS — R42 Dizziness and giddiness: Secondary | ICD-10-CM | POA: Diagnosis not present

## 2018-05-31 DIAGNOSIS — G1220 Motor neuron disease, unspecified: Secondary | ICD-10-CM

## 2018-05-31 NOTE — Telephone Encounter (Signed)
Incoming call from pt Pt is having continued slurred speech, dizziness, vision changes, gait instability and some confusion. Pt also is having continued problem with excessive drooling. Per Dr Louanne Skye, pt needs stat MRI. MRI scheduled, pt notified. Pt strongly encouraged to go to ER Verbalizes understanding

## 2018-05-31 NOTE — Telephone Encounter (Signed)
Pt waiting at [facility] MRI Aware MRI is normal

## 2018-06-01 ENCOUNTER — Telehealth (HOSPITAL_COMMUNITY): Payer: Self-pay

## 2018-06-01 NOTE — Telephone Encounter (Signed)
Encounter complete. 

## 2018-06-03 ENCOUNTER — Ambulatory Visit (HOSPITAL_COMMUNITY)
Admission: RE | Admit: 2018-06-03 | Discharge: 2018-06-03 | Disposition: A | Discharge status: Home / Self Care | Payer: 59 | Source: Ambulatory Visit | Attending: Cardiovascular Disease | Admitting: Cardiovascular Disease

## 2018-06-03 ENCOUNTER — Other Ambulatory Visit: Payer: Self-pay

## 2018-06-03 DIAGNOSIS — R072 Precordial pain: Secondary | ICD-10-CM | POA: Diagnosis not present

## 2018-06-03 LAB — EXERCISE TOLERANCE TEST
Estimated workload: 10 METS
Exercise duration (min): 7 min
Exercise duration (sec): 59 s
MPHR: 167 {beats}/min
Peak HR: 142 {beats}/min
Percent HR: 85 %
RPE: 18
Rest HR: 80 {beats}/min

## 2018-06-13 ENCOUNTER — Other Ambulatory Visit: Payer: Self-pay | Admitting: Family Medicine

## 2018-06-14 NOTE — Telephone Encounter (Signed)
Last seen 05/26/2018

## 2018-08-02 ENCOUNTER — Ambulatory Visit
Admission: EM | Admit: 2018-08-02 | Discharge: 2018-08-02 | Disposition: A | Discharge status: Home / Self Care | Payer: 59 | Attending: Emergency Medicine | Admitting: Emergency Medicine

## 2018-08-02 ENCOUNTER — Ambulatory Visit: Payer: 59 | Admitting: Family Medicine

## 2018-08-02 ENCOUNTER — Other Ambulatory Visit: Payer: Self-pay

## 2018-08-02 DIAGNOSIS — K1379 Other lesions of oral mucosa: Secondary | ICD-10-CM | POA: Diagnosis not present

## 2018-08-02 DIAGNOSIS — Z720 Tobacco use: Secondary | ICD-10-CM

## 2018-08-02 MED ORDER — BENZOCAINE 7.5 % MT GEL: Freq: Three times a day (TID) | OROMUCOSAL | 0 refills | Status: AC | PRN

## 2018-08-02 MED ORDER — CLINDAMYCIN HCL 300 MG PO CAPS: 300.0000 mg | ORAL_CAPSULE | Freq: Three times a day (TID) | ORAL | 0 refills | Status: AC

## 2018-08-02 NOTE — Discharge Instructions (Signed)
Rest and push fluids Orajel prescribed.  Use as needed for pain and inflammation Clindamycin prescribed for potential oral infection.  Take as directed and to completion Follow up with PCP for further evaluation and management of oral mass/ sore, voice changes and weight loss (20lbs in 1 year) You may benefit a referral to ENT.  I have included info for a local ENT if you want to schedule an appointment Go to the ED if you have any new or worsening symptoms such as fever, chills, difficulty swallowing, painful swallowing, oral or neck swelling, nausea, vomiting, chest pain, shortness of breath, difficulty breathing, etc..Marland Kitchen

## 2018-08-02 NOTE — ED Triage Notes (Signed)
Pt presents with complaints of a sore on roof of mouth that started last Friday. Reports sore is worsening over time.  Patient reports difficulty speech since February, states his PCP was concerned for a sinus infection and gave him an antibiotic. Denies any improvement.  Patient also reports cough x 1 day.
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
## 2018-08-09 ENCOUNTER — Other Ambulatory Visit: Payer: Self-pay | Admitting: Family Medicine

## 2018-08-09 DIAGNOSIS — F17200 Nicotine dependence, unspecified, uncomplicated: Secondary | ICD-10-CM

## 2018-08-15 ENCOUNTER — Other Ambulatory Visit: Payer: Self-pay | Admitting: Family Medicine

## 2018-08-17 ENCOUNTER — Encounter (HOSPITAL_COMMUNITY): Payer: Self-pay | Admitting: *Deleted

## 2018-08-17 ENCOUNTER — Emergency Department (HOSPITAL_COMMUNITY): Payer: 59

## 2018-08-17 ENCOUNTER — Other Ambulatory Visit: Payer: Self-pay

## 2018-08-17 ENCOUNTER — Emergency Department (HOSPITAL_COMMUNITY)
Admission: EM | Admit: 2018-08-17 | Discharge: 2018-08-17 | Disposition: A | Discharge status: Home / Self Care | Payer: 59 | Attending: Emergency Medicine | Admitting: Emergency Medicine

## 2018-08-17 DIAGNOSIS — I10 Essential (primary) hypertension: Secondary | ICD-10-CM | POA: Diagnosis not present

## 2018-08-17 DIAGNOSIS — W19XXXA Unspecified fall, initial encounter: Secondary | ICD-10-CM

## 2018-08-17 DIAGNOSIS — M79605 Pain in left leg: Secondary | ICD-10-CM | POA: Insufficient documentation

## 2018-08-17 DIAGNOSIS — M79602 Pain in left arm: Secondary | ICD-10-CM | POA: Diagnosis not present

## 2018-08-17 DIAGNOSIS — R51 Headache: Secondary | ICD-10-CM | POA: Diagnosis not present

## 2018-08-17 DIAGNOSIS — S0990XA Unspecified injury of head, initial encounter: Secondary | ICD-10-CM

## 2018-08-17 DIAGNOSIS — S39012A Strain of muscle, fascia and tendon of lower back, initial encounter: Secondary | ICD-10-CM | POA: Diagnosis not present

## 2018-08-17 DIAGNOSIS — S161XXA Strain of muscle, fascia and tendon at neck level, initial encounter: Secondary | ICD-10-CM | POA: Insufficient documentation

## 2018-08-17 DIAGNOSIS — S0993XA Unspecified injury of face, initial encounter: Secondary | ICD-10-CM | POA: Diagnosis present

## 2018-08-17 DIAGNOSIS — W01198A Fall on same level from slipping, tripping and stumbling with subsequent striking against other object, initial encounter: Secondary | ICD-10-CM | POA: Diagnosis not present

## 2018-08-17 DIAGNOSIS — Y9289 Other specified places as the place of occurrence of the external cause: Secondary | ICD-10-CM | POA: Diagnosis not present

## 2018-08-17 DIAGNOSIS — S0083XA Contusion of other part of head, initial encounter: Secondary | ICD-10-CM | POA: Diagnosis not present

## 2018-08-17 DIAGNOSIS — Y999 Unspecified external cause status: Secondary | ICD-10-CM | POA: Insufficient documentation

## 2018-08-17 DIAGNOSIS — Y93H9 Activity, other involving exterior property and land maintenance, building and construction: Secondary | ICD-10-CM | POA: Diagnosis not present

## 2018-08-17 DIAGNOSIS — J45909 Unspecified asthma, uncomplicated: Secondary | ICD-10-CM | POA: Diagnosis not present

## 2018-08-17 DIAGNOSIS — F17210 Nicotine dependence, cigarettes, uncomplicated: Secondary | ICD-10-CM | POA: Diagnosis not present

## 2018-08-17 NOTE — Discharge Instructions (Signed)
You were seen in the Emergency Department (ED) today for a head injury.  Based on your evaluation, you may have sustained a concussion or injury to the brain.  Your CT scan done did not show any evidence of serious injury or bleeding.    Symptoms to expect from a concussion include nausea, mild to moderate headache, difficulty concentrating or sleeping, and mild lightheadedness.  These symptoms should improve over the next few days to weeks, but it may take many weeks before you feel back to normal.  Return to the emergency department or follow-up with your primary care doctor if your symptoms are not improving over this time.  Signs of a more serious head injury include vomiting, severe headache, excessive sleepiness or confusion, and weakness or numbness in your face, arms or legs.  Return immediately to the Emergency Department if you experience any of these more concerning symptoms.    Rest, avoid strenuous physical or mental activity, and avoid activities that could potentially result in another head injury until all your symptoms from this head injury are completely resolved for at least 2-3 weeks. You may take ibuprofen or acetaminophen over the counter according to label instructions for mild headache or scalp soreness.
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
## 2018-08-17 NOTE — ED Triage Notes (Signed)
Pt reports he fell today face first while outside trimming some tree limbs. Denies LOC, n/v. Pt c/o pain to his left forehead, left cheek, left lower back and right side of head. Pt also reports that "things still feel like they are spinning".

## 2018-08-24 ENCOUNTER — Other Ambulatory Visit: Payer: Self-pay

## 2018-08-24 ENCOUNTER — Emergency Department (HOSPITAL_COMMUNITY): Payer: 59

## 2018-08-24 ENCOUNTER — Emergency Department (HOSPITAL_COMMUNITY)
Admission: EM | Admit: 2018-08-24 | Discharge: 2018-08-24 | Disposition: A | Discharge status: Home / Self Care | Payer: 59 | Attending: Emergency Medicine | Admitting: Emergency Medicine

## 2018-08-24 ENCOUNTER — Encounter (HOSPITAL_COMMUNITY): Payer: Self-pay

## 2018-08-24 DIAGNOSIS — Y998 Other external cause status: Secondary | ICD-10-CM | POA: Insufficient documentation

## 2018-08-24 DIAGNOSIS — Y9289 Other specified places as the place of occurrence of the external cause: Secondary | ICD-10-CM | POA: Insufficient documentation

## 2018-08-24 DIAGNOSIS — F17210 Nicotine dependence, cigarettes, uncomplicated: Secondary | ICD-10-CM | POA: Diagnosis not present

## 2018-08-24 DIAGNOSIS — E785 Hyperlipidemia, unspecified: Secondary | ICD-10-CM | POA: Diagnosis not present

## 2018-08-24 DIAGNOSIS — W010XXA Fall on same level from slipping, tripping and stumbling without subsequent striking against object, initial encounter: Secondary | ICD-10-CM | POA: Diagnosis not present

## 2018-08-24 DIAGNOSIS — W19XXXA Unspecified fall, initial encounter: Secondary | ICD-10-CM

## 2018-08-24 DIAGNOSIS — Z79899 Other long term (current) drug therapy: Secondary | ICD-10-CM | POA: Insufficient documentation

## 2018-08-24 DIAGNOSIS — S0990XA Unspecified injury of head, initial encounter: Secondary | ICD-10-CM | POA: Diagnosis present

## 2018-08-24 DIAGNOSIS — S060X0A Concussion without loss of consciousness, initial encounter: Secondary | ICD-10-CM

## 2018-08-24 DIAGNOSIS — Y9389 Activity, other specified: Secondary | ICD-10-CM | POA: Diagnosis not present

## 2018-08-24 DIAGNOSIS — I10 Essential (primary) hypertension: Secondary | ICD-10-CM | POA: Diagnosis not present

## 2018-08-24 LAB — CBC WITH DIFFERENTIAL/PLATELET
Abs Immature Granulocytes: 0.02 10*3/uL (ref 0.00–0.07)
Basophils Absolute: 0.1 10*3/uL (ref 0.0–0.1)
Basophils Relative: 1 %
Eosinophils Absolute: 0.6 10*3/uL — ABNORMAL HIGH (ref 0.0–0.5)
Eosinophils Relative: 6 %
HCT: 44.2 % (ref 39.0–52.0)
Hemoglobin: 14.8 g/dL (ref 13.0–17.0)
Immature Granulocytes: 0 %
Lymphocytes Relative: 31 %
Lymphs Abs: 3.1 10*3/uL (ref 0.7–4.0)
MCH: 30.8 pg (ref 26.0–34.0)
MCHC: 33.5 g/dL (ref 30.0–36.0)
MCV: 92.1 fL (ref 80.0–100.0)
Monocytes Absolute: 0.5 10*3/uL (ref 0.1–1.0)
Monocytes Relative: 5 %
Neutro Abs: 5.8 10*3/uL (ref 1.7–7.7)
Neutrophils Relative %: 57 %
Platelets: 273 10*3/uL (ref 150–400)
RBC: 4.8 MIL/uL (ref 4.22–5.81)
RDW: 13.4 % (ref 11.5–15.5)
WBC: 10 10*3/uL (ref 4.0–10.5)
nRBC: 0 % (ref 0.0–0.2)

## 2018-08-24 LAB — BASIC METABOLIC PANEL
Anion gap: 12 (ref 5–15)
BUN: 20 mg/dL (ref 6–20)
CO2: 26 mmol/L (ref 22–32)
Calcium: 9.1 mg/dL (ref 8.9–10.3)
Chloride: 99 mmol/L (ref 98–111)
Creatinine, Ser: 1.03 mg/dL (ref 0.61–1.24)
GFR calc Af Amer: >60 mL/min (ref >60)
GFR calc non Af Amer: >60 mL/min (ref >60)
Glucose, Bld: 89 mg/dL (ref 70–99)
Potassium: 3.1 mmol/L — ABNORMAL LOW (ref 3.5–5.1)
Sodium: 137 mmol/L (ref 135–145)
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
## 2018-08-24 NOTE — ED Triage Notes (Signed)
Pt presents to ED with complaint of fall. Pt states he was was just walking and tripped and fell. Pt states he hit his head yesterday but denies LOC. Pt states he has had multiple falls since his fall on 08/17/18.

## 2018-08-24 NOTE — Discharge Instructions (Addendum)
You have been diagnosed today with fall resulting in head injury and concussion.  At this time there does not appear to be the presence of an emergent medical condition, however there is always the potential for conditions to change. Please read and follow the below instructions.  Please return to the Emergency Department immediately for any new or worsening symptoms Please be sure to follow up with your Primary Care Provider within one week regarding your visit today; please call their office to schedule an appointment even if you are feeling better for a follow-up visit. Please avoid further head injury as to not worsen your concussion.  If you sustain additional head injuries or falls or have any new or concerning symptoms return to the ER immediately. Please monitor your small abrasion for signs of infection, redness gently soap and water twice daily today.  Return to the emergency department for signs of infection including pain, swelling, redness/streaking or fever or any other concerning symptoms.  Get help right away if: You have: A very bad (severe) headache that is not helped by medicine. Trouble walking or weakness in your arms and legs. Clear or bloody fluid coming from your nose or ears. Changes in your seeing (vision). Jerky movements that you cannot control (seizure). You throw up (vomit). Your symptoms get worse. You lose balance. Your speech is slurred. You pass out. You are sleepier and have trouble staying awake. The black centers of your eyes (pupils) change in size. You have bad headaches or your headaches get worse. You have weakness in any part of your body. You are confused. Your coordination gets worse. You keep throwing up (vomiting). You feel more sleepy than normal. You twitch or shake violently (convulse) or have a seizure. Your speech is not clear (is slurred). You have strange behavior changes. You have changes in how you see (vision). You pass out  (lose consciousness).  Please read the additional information packets attached to your discharge summary.

## 2018-09-07 ENCOUNTER — Other Ambulatory Visit: Payer: Self-pay

## 2018-09-08 ENCOUNTER — Ambulatory Visit: Payer: 59 | Admitting: Family Medicine

## 2018-09-08 ENCOUNTER — Encounter: Payer: Self-pay | Admitting: Family Medicine

## 2018-09-08 ENCOUNTER — Ambulatory Visit (INDEPENDENT_AMBULATORY_CARE_PROVIDER_SITE_OTHER): Payer: 59

## 2018-09-08 VITALS — BP 115/77 | HR 90 | Temp 98.2°F | Ht 70.0 in | Wt 146.4 lb

## 2018-09-08 DIAGNOSIS — K219 Gastro-esophageal reflux disease without esophagitis: Secondary | ICD-10-CM

## 2018-09-08 DIAGNOSIS — R269 Unspecified abnormalities of gait and mobility: Secondary | ICD-10-CM

## 2018-09-08 DIAGNOSIS — E785 Hyperlipidemia, unspecified: Secondary | ICD-10-CM

## 2018-09-08 DIAGNOSIS — R2689 Other abnormalities of gait and mobility: Secondary | ICD-10-CM | POA: Insufficient documentation

## 2018-09-08 DIAGNOSIS — F17200 Nicotine dependence, unspecified, uncomplicated: Secondary | ICD-10-CM

## 2018-09-08 DIAGNOSIS — F339 Major depressive disorder, recurrent, unspecified: Secondary | ICD-10-CM

## 2018-09-08 DIAGNOSIS — I10 Essential (primary) hypertension: Secondary | ICD-10-CM | POA: Diagnosis not present

## 2018-09-08 LAB — LIPID PANEL

## 2018-09-08 MED ORDER — NITROGLYCERIN 0.4 MG SL SUBL: 0.4000 mg | SUBLINGUAL_TABLET | SUBLINGUAL | 2 refills | Status: AC | PRN

## 2018-09-08 MED ORDER — ALBUTEROL SULFATE HFA 108 (90 BASE) MCG/ACT IN AERS: 2.0000 | INHALATION_SPRAY | Freq: Four times a day (QID) | RESPIRATORY_TRACT | 11 refills | Status: AC | PRN

## 2018-09-08 MED ORDER — HYDROCHLOROTHIAZIDE 25 MG PO TABS: 12.5000 mg | ORAL_TABLET | Freq: Every day | ORAL | 3 refills | Status: AC

## 2018-09-08 NOTE — Progress Notes (Signed)
BP 115/77   Pulse 90   Temp 98.2 F (36.8 C) (Oral)   Ht _0  (1.778 m)   Wt 146 lb 6.4 oz (66.4 kg)   BMI 21.01 kg/m    Subjective:   Patient ID: Anthony Booth, male    DOB: 1965/10/27, 53 y.o.   MRN: [id number]  HPI: Anthony Booth is a 53 y.o. male presenting on 09/08/2018 for Medical Management of Chronic Issues (Patient states he has been lossing weight and it not trying to.), Depression, and Hospitalization Follow-up (fall- 6/2 AP)   HPI Hypertension Patient is currently on hydrochlorothiazide and verapamil, and their blood pressure today is 115/77. Patient denies any lightheadedness or dizziness. Patient denies headaches, blurred vision, chest pains, shortness of breath, or weakness. Denies any side effects from medication and is content with current medication.   Hyperlipidemia Patient is coming in for recheck of his hyperlipidemia. The patient is currently taking Lipitor. They deny any issues with myalgias or history of liver damage from it. They deny any focal numbness or weakness or chest pain.   Patient is concerned because he has been having increased gait and balance issues.  He says he is concerned because of family history of MS and he thinks that may be what is getting.  He denies any visual disturbances that he has any time recently.  He denies any specific weakness but just in general he feels weaker and he feels like he is having trouble with posture and balance and feels like he is at risk to possibly fall over sometimes.  He is also been losing weight and have a lot of stress recently because of the coronavirus and his wife's health.  He is concerned that this could be related to this but does not necessarily know what.  He admits to having increased indigestion due to his wife's health and stress.  Relevant past medical, surgical, family and social history reviewed and updated as indicated. Interim medical history since our last visit reviewed. Allergies  and medications reviewed and updated.  Review of Systems  Constitutional: Positive for fatigue. Negative for chills and fever.  Respiratory: Negative for shortness of breath and wheezing.   Cardiovascular: Negative for chest pain and leg swelling.  Gastrointestinal: Positive for abdominal distention and nausea. Negative for abdominal pain, constipation, diarrhea and vomiting.  Musculoskeletal: Positive for gait problem. Negative for back pain.  Skin: Negative for rash.  Neurological: Positive for weakness. Negative for dizziness, light-headedness and numbness.  All other systems reviewed and are negative.   Per HPI unless specifically indicated above   Allergies as of 09/08/2018      Reactions   Penicillins Nausea Only   Lovenox [enoxaparin Sodium] Anxiety      Medication List       Accurate as of September 08, 2018  8:27 AM. If you have any questions, ask your nurse or doctor.        albuterol 108 (90 Base) MCG/ACT inhaler Commonly known as: VENTOLIN HFA Inhale 2 puffs into the lungs every 6 (six) hours as needed for wheezing or shortness of breath.   aspirin EC 81 MG tablet Take 81 mg by mouth daily.   atorvastatin 20 MG tablet Commonly known as: LIPITOR Take 1 tablet (20 mg total) by mouth daily.   baclofen 10 MG tablet Commonly known as: LIORESAL Take 1 tablet by mouth twice daily   benzocaine 7.5 % oral gel Commonly known as: BABY ORAJEL Use as directed in the mouth  or throat 3 (three) times daily as needed for pain.   cetirizine 10 MG tablet Commonly known as: ZYRTEC Take 1 tablet by mouth once daily   escitalopram 10 MG tablet Commonly known as: LEXAPRO Take 1 tablet (10 mg total) by mouth daily.   fluticasone 50 MCG/ACT nasal spray Commonly known as: FLONASE Place 1 spray into both nostrils 2 (two) times daily as needed for allergies or rhinitis.   hydrochlorothiazide 25 MG tablet Commonly known as: HYDRODIURIL Take 1 tablet (25 mg total) by mouth  daily.   naproxen 500 MG tablet Commonly known as: NAPROSYN TAKE 1 TABLET BY MOUTH TWICE DAILY WITH A MEAL   nitroGLYCERIN 0.4 MG SL tablet Commonly known as: NITROSTAT Place 1 tablet (0.4 mg total) under the tongue every 5 (five) minutes as needed for chest pain.   omeprazole 20 MG capsule Commonly known as: PRILOSEC Take 1 capsule by mouth once daily   verapamil 240 MG 24 hr capsule Commonly known as: VERELAN PM TAKE 1 CAPSULE BY MOUTH IN THE MORNING   vitamin C 500 MG tablet Commonly known as: ASCORBIC ACID Take 500 mg by mouth daily.   Vitamin D3 125 MCG (5000 UT) Tabs Take 5,000 Units by mouth daily.        Objective:   BP 115/77   Pulse 90   Temp 98.2 F (36.8 C) (Oral)   Ht '5\' 10"'$  (1.778 m)   Wt 146 lb 6.4 oz (66.4 kg)   BMI 21.01 kg/m   Wt Readings from Last 3 Encounters:  09/08/18 146 lb 6.4 oz (66.4 kg)  08/24/18 150 lb (68 kg)  08/17/18 160 lb (72.6 kg)    Physical Exam Vitals signs and nursing note reviewed.  Constitutional:      General: He is not in acute distress.    Appearance: He is well-developed. He is not diaphoretic.  Eyes:     General: No scleral icterus.    Conjunctiva/sclera: Conjunctivae normal.  Neck:     Musculoskeletal: Neck supple.     Thyroid: No thyromegaly.  Cardiovascular:     Rate and Rhythm: Normal rate and regular rhythm.     Heart sounds: Normal heart sounds. No murmur.  Pulmonary:     Effort: Pulmonary effort is normal. No respiratory distress.     Breath sounds: Normal breath sounds. No wheezing.  Musculoskeletal: Normal range of motion.  Lymphadenopathy:     Cervical: No cervical adenopathy.  Skin:    General: Skin is warm and dry.     Findings: No rash.  Neurological:     Mental Status: He is alert and oriented to person, place, and time.     Cranial Nerves: No cranial nerve deficit.     Sensory: No sensory deficit.     Motor: No weakness (No weakness noted on exam).     Coordination: Coordination normal.      Gait: Gait normal.  Psychiatric:        Behavior: Behavior normal.     Assessment & Plan:   Problem List Items Addressed This Visit      Cardiovascular and Mediastinum   Hypertension - Primary   Relevant Medications   nitroGLYCERIN (NITROSTAT) 0.4 MG SL tablet   Other Relevant Orders   CMP14+EGFR     Digestive   GERD (gastroesophageal reflux disease)   Relevant Orders   CBC with Differential/Platelet     Other   Hyperlipidemia LDL goal <130   Relevant Medications   nitroGLYCERIN (NITROSTAT)  0.4 MG SL tablet   Other Relevant Orders   Lipid panel   Depression, recurrent ([place])   Smokes less than 1 pack a day with greater than 25 pack year history   Relevant Medications   albuterol (VENTOLIN HFA) 108 (90 Base) MCG/ACT inhaler   Balance problem   Relevant Orders   Ambulatory referral to Neurology   Gait disorder   Relevant Orders   Ambulatory referral to Neurology      Because of balance and gait disorder and family history of MS patient would like to go see a neurologist, also possibility that he may have had a small stroke, he has been falling more recently and gave some physical therapy exercises he can do at home as well  We will check blood work and continue current medication.   His symptoms could be related to increased stress and anxiety but will go see a neurologist and give some physical therapy exercises.  His symptoms could also possibly be from lower blood pressure and we will keep a close eye on that but may consider backing off on blood pressure again although the last time that we did this he went way too high. Follow up plan: Return in about 3 months (around 12/09/2018), or if symptoms worsen or fail to improve, for Recheck breathing and cholesterol and depression.  Counseling provided for all of the vaccine components No orders of the defined types were placed in this encounter.   Caryl Pina, MD [facility]  09/08/2018, 8:27 AM

## 2018-09-09 LAB — CMP14+EGFR
ALT: 18 IU/L (ref 0–44)
AST: 20 IU/L (ref 0–40)
Albumin/Globulin Ratio: 1.6 (ref 1.2–2.2)
Albumin: 4.3 g/dL (ref 3.8–4.9)
Alkaline Phosphatase: 74 IU/L (ref 39–117)
BUN/Creatinine Ratio: 15 (ref 9–20)
BUN: 15 mg/dL (ref 6–24)
Bilirubin Total: 0.3 mg/dL (ref 0.0–1.2)
CO2: 28 mmol/L (ref 20–29)
Calcium: 10 mg/dL (ref 8.7–10.2)
Chloride: 97 mmol/L (ref 96–106)
Creatinine, Ser: 1.01 mg/dL (ref 0.76–1.27)
GFR calc Af Amer: 97 mL/min/{1.73_m2} (ref >59)
GFR calc non Af Amer: 84 mL/min/{1.73_m2} (ref >59)
Globulin, Total: 2.7 g/dL (ref 1.5–4.5)
Glucose: 49 mg/dL — ABNORMAL LOW (ref 65–99)
Potassium: 3.7 mmol/L (ref 3.5–5.2)
Sodium: 140 mmol/L (ref 134–144)
Total Protein: 7 g/dL (ref 6.0–8.5)

## 2018-09-09 LAB — CBC WITH DIFFERENTIAL/PLATELET
Basophils Absolute: 0.1 10*3/uL (ref 0.0–0.2)
Basos: 1 %
EOS (ABSOLUTE): 0.5 10*3/uL — ABNORMAL HIGH (ref 0.0–0.4)
Eos: 7 %
Hematocrit: 45.6 % (ref 37.5–51.0)
Hemoglobin: 15.7 g/dL (ref 13.0–17.7)
Immature Grans (Abs): 0 10*3/uL (ref 0.0–0.1)
Immature Granulocytes: 0 %
Lymphocytes Absolute: 2.2 10*3/uL (ref 0.7–3.1)
Lymphs: 28 %
MCH: 31.2 pg (ref 26.6–33.0)
MCHC: 34.4 g/dL (ref 31.5–35.7)
MCV: 91 fL (ref 79–97)
Monocytes Absolute: 0.6 10*3/uL (ref 0.1–0.9)
Monocytes: 8 %
Neutrophils Absolute: 4.4 10*3/uL (ref 1.4–7.0)
Neutrophils: 56 %
Platelets: 344 10*3/uL (ref 150–450)
RBC: 5.03 x10E6/uL (ref 4.14–5.80)
RDW: 13.1 % (ref 11.6–15.4)
WBC: 7.8 10*3/uL (ref 3.4–10.8)

## 2018-09-09 LAB — LIPID PANEL
Chol/HDL Ratio: 3.8 ratio (ref 0.0–5.0)
Cholesterol, Total: 147 mg/dL (ref 100–199)
HDL: 39 mg/dL — ABNORMAL LOW (ref >39)
LDL Calculated: 83 mg/dL (ref 0–99)
Triglycerides: 123 mg/dL (ref 0–149)
VLDL Cholesterol Cal: 25 mg/dL (ref 5–40)

## 2018-09-15 ENCOUNTER — Telehealth: Payer: Self-pay | Admitting: Diagnostic Neuroimaging

## 2018-09-15 NOTE — Telephone Encounter (Signed)
Pt gave consent.  Pt understands that although there may be some limitations with this type of visit, we will take all precautions to reduce any security or privacy concerns.  Pt understands that this will be treated like an in office visit and we will file with pt's insurance, and there may be a patient responsible charge related to this service. °

## 2018-09-16 ENCOUNTER — Encounter: Payer: Self-pay | Admitting: Diagnostic Neuroimaging

## 2018-09-16 NOTE — Telephone Encounter (Signed)
EMR updated.

## 2018-09-18 ENCOUNTER — Other Ambulatory Visit: Payer: Self-pay | Admitting: Family Medicine

## 2018-09-20 ENCOUNTER — Encounter: Payer: Self-pay | Admitting: Diagnostic Neuroimaging

## 2018-09-20 ENCOUNTER — Telehealth (INDEPENDENT_AMBULATORY_CARE_PROVIDER_SITE_OTHER): Payer: 59 | Admitting: Diagnostic Neuroimaging

## 2018-09-20 DIAGNOSIS — R1310 Dysphagia, unspecified: Secondary | ICD-10-CM

## 2018-09-20 DIAGNOSIS — R269 Unspecified abnormalities of gait and mobility: Secondary | ICD-10-CM

## 2018-09-20 DIAGNOSIS — R471 Dysarthria and anarthria: Secondary | ICD-10-CM | POA: Diagnosis not present
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
## 2018-10-11 ENCOUNTER — Other Ambulatory Visit: Payer: Self-pay | Admitting: Family Medicine

## 2018-10-11 MED ORDER — NAPROXEN 500 MG PO TABS: ORAL_TABLET | ORAL | 0 refills | Status: DC

## 2018-10-11 NOTE — Telephone Encounter (Signed)
Refill sent.

## 2018-10-11 NOTE — Telephone Encounter (Signed)
What is the name of the medication? Naproxen   Have you contacted your pharmacy to request a refill? yes  Which pharmacy would you like this sent to? [facility]   Patient notified that their request is being sent to the clinical staff for review and that they should receive a call once it is complete. If they do not receive a call within 24 hours they can check with their pharmacy or our office.

## 2018-10-13 ENCOUNTER — Other Ambulatory Visit: Payer: Self-pay | Admitting: Family Medicine

## 2018-10-13 DIAGNOSIS — J418 Mixed simple and mucopurulent chronic bronchitis: Secondary | ICD-10-CM

## 2018-10-13 NOTE — Progress Notes (Signed)
Patient wants to be tested for covid, no known exposure but higher risk

## 2018-10-14 ENCOUNTER — Other Ambulatory Visit: Payer: Self-pay

## 2018-10-14 DIAGNOSIS — Z20822 Contact with and (suspected) exposure to covid-19: Secondary | ICD-10-CM

## 2018-10-17 LAB — NOVEL CORONAVIRUS, NAA: SARS-CoV-2, NAA: NOT DETECTED

## 2018-10-22 ENCOUNTER — Other Ambulatory Visit: Payer: Self-pay | Admitting: Family Medicine

## 2018-10-22 MED ORDER — BACLOFEN 10 MG PO TABS: 10.0000 mg | ORAL_TABLET | Freq: Two times a day (BID) | ORAL | 0 refills | Status: DC

## 2018-11-04 ENCOUNTER — Telehealth: Payer: Self-pay

## 2018-11-04 NOTE — Telephone Encounter (Signed)
-----   Message -----  From: Gaspar Bidding Niblett  Sent: 10/29/2018 11:49 AM EDT  To: [facility]  Subject: Visit Follow-Up Question               I am still waiting for the EMG to be scheduled and it was over a month ago you said you wanted it done. As of today no one has contacted me. I reached out to the office and was told due to the back log I couldn't setup an appointment and had to wait to be contacted.   Dr. Leta Baptist, I'm not sure who the patient spoke with and advised him that he could not be scheduled when he called in, but your next available EMG is 12/23/2018. I wanted to check with you because I see in your notes that you were wanting to check CIDP vs. ALS, would you like to work him in sooner or with another provider or is it ok to wait until October?

## 2018-11-08 ENCOUNTER — Other Ambulatory Visit: Payer: Self-pay | Admitting: Family Medicine

## 2018-11-08 DIAGNOSIS — F17200 Nicotine dependence, unspecified, uncomplicated: Secondary | ICD-10-CM

## 2018-11-08 MED ORDER — CETIRIZINE HCL 10 MG PO TABS: 10.0000 mg | ORAL_TABLET | Freq: Every day | ORAL | 0 refills | Status: DC

## 2018-11-08 NOTE — Telephone Encounter (Signed)
Great thanks. -VRP

## 2018-11-08 NOTE — Telephone Encounter (Signed)
I have spoken with patient and got him scheduled for this Wednesday afternoon, 11/10/18. (NCV at 2:15p and EMG at 3p).

## 2018-11-10 ENCOUNTER — Other Ambulatory Visit: Payer: Self-pay

## 2018-11-10 ENCOUNTER — Ambulatory Visit (INDEPENDENT_AMBULATORY_CARE_PROVIDER_SITE_OTHER): Payer: 59 | Admitting: Diagnostic Neuroimaging

## 2018-11-10 ENCOUNTER — Encounter (INDEPENDENT_AMBULATORY_CARE_PROVIDER_SITE_OTHER): Payer: 59 | Admitting: Diagnostic Neuroimaging

## 2018-11-10 DIAGNOSIS — Z0289 Encounter for other administrative examinations: Secondary | ICD-10-CM

## 2018-11-10 DIAGNOSIS — M6281 Muscle weakness (generalized): Secondary | ICD-10-CM | POA: Diagnosis not present

## 2018-11-10 DIAGNOSIS — R269 Unspecified abnormalities of gait and mobility: Secondary | ICD-10-CM

## 2018-11-10 DIAGNOSIS — R1310 Dysphagia, unspecified: Secondary | ICD-10-CM

## 2018-11-10 DIAGNOSIS — R471 Dysarthria and anarthria: Secondary | ICD-10-CM

## 2018-11-10 NOTE — Progress Notes (Signed)
Reviewed symptoms and exam today.  Patient has had progressive slurred speech, trouble swallowing, lower extremity weakness, right greater than left, since February 2020.  He is noted some mild numbness in his toes and feet at the onset of symptoms but this has resolved.  He has had 50 pound weight loss over the past 6 months, unintentional.  Neurologic examination today notable for moderate to secere dysarthria, fasciculations in deltoids, pectoralis, triceps, forearm flexors, quadriceps muscles.  Significant atrophy in upper and lower extremities.  Rare fasciculations in the tongue.  Upper extremity 5 out of 5 except triceps 4, grip 3+, finger abduction 3.  Lower extremities hip flexion 2-3, knee extension and flexion 4, dorsiflexion 3.  Sensory examination is unremarkable.  Has significant hyperreflexia of the upper and lower extremities with positive Hoffmann signs.  Plantar stimulation demonstrates flexor response in the toes.  Patient has wide-based unsteady steppage gait.  Will proceed with further workup for motor neuron disease, myelopathy, myopathy, NMJ dz.   [facility]   Offered home PT, OT, ST eval (caution with gait, swallowing, driving).    Orders Placed This Encounter  Procedures  . MR CERVICAL SPINE W WO CONTRAST  . DG FL GUIDED LUMBAR PUNCTURE  . CK  . Aldolase  . Vitamin B12  . AChR Abs with Reflex to MuSK  . ANA,IFA RA Diag Pnl w/rflx Tit/Patn  . Pan-ANCA  . GM1 IgM Autoantibodies  . GM1 IgG Autoantibodies  . CBC with Differential/Platelet  . Comprehensive metabolic panel  . TSH  . Angiotensin converting enzyme  . MAG IgM Antibodies  . HIV antibody (with reflex)  . Lyme Ab/Western Blot Reflex    Penni Bombard, MD 1/54/0086, 7:61 PM Certified in Neurology, Neurophysiology and Neuroimaging  [facility] [address] [place], [place] [address] [phone]

## 2018-11-10 NOTE — Addendum Note (Signed)
Addended by: Andrey Spearman R on: 11/10/2018 04:49 PM   Modules accepted: Orders

## 2018-11-10 NOTE — Procedures (Addendum)
[facility]  NCS (NERVE CONDUCTION STUDY) WITH EMG (ELECTROMYOGRAPHY) REPORT   STUDY DATE: 11/10/18 PATIENT NAME: Anthony Booth Anello DOB: 09/13/1965 MRN: [id number]  ORDERING CLINICIAN: Joycelyn SchmidVikram Olivette Beckmann, MD   TECHNOLOGIST: Durenda AgeN Gann ELECTROMYOGRAPHER: Glenford BayleyVikram R. Christipher Rieger, MD  CLINICAL INFORMATION: 53 year old male with slurred speech, trouble swallowing, muscle weakness, fasciculations, hyperreflexia.  Evaluate for motor neuron disease.  FINDINGS: NERVE CONDUCTION STUDY: Right median and right ulnar motor responses of normal distal latencies, decreased amplitudes, normal conduction velocities.  Right peroneal and right tibial motor responses have prolonged distal latencies, decreased amplitudes, slow conduction velocities.  Left peroneal motor responses normal distal latency, decreased amplitude, slightly slow conduction velocity.  Left tibial motor responses normal.  Right peroneal F wave latency could not be obtained.  Right tibial, right median, left peroneal F wave latencies are prolonged.    Right ulnar and left tibial F wave latencies are normal.  Right radial, right median, right ulnar, bilateral sural and bilateral superficial peroneal sensory responses are normal.   NEEDLE ELECTROMYOGRAPHY:  Needle examination of right upper and lower extremities notable for abnormal spontaneous activity with fasciculations in multiple muscles in the cervical, thoracic, lumbar myotomes.  Decreased motor unit recruitment noted on exertion.  See EMG summary table for details.    IMPRESSION:   Abnormal study demonstrating: - Widespread abnormalities of motor nerve responses with predominantly axonal features; demyelinating changes noted in the right peroneal motor response.  Sensory nerve responses are normal.  Needle EMG notable for active and chronic denervation changes with widespread fasciculations noted in cervical, thoracic and lumbar region. Hyperreflexia noted  on exam suggesting upper motor neuron dysfunction. Findings are consistent with a disorder of the motor neurons and/or their axons.    INTERPRETING PHYSICIAN:  Suanne MarkerVIKRAM R. Paulino Cork, MD Certified in Neurology, Neurophysiology and Neuroimaging  [facility] [address], [address] [place], [place] [address] [phone]  [facility]    Nerve / Sites Muscle Latency Ref. Amplitude Ref. Rel Amp Segments Distance Velocity Ref. Area    ms ms mV mV %  cm m/s m/s mVms  R Median - APB     Wrist APB 4.2 ?4.4 1.6 ?4.0 100 Wrist - APB 7   10.2     Upper arm APB 9.2  1.8  109 Upper arm - Wrist 24 49 ?49 11.1  R Ulnar - ADM     Wrist ADM 2.8 ?3.3 5.6 ?6.0 100 Wrist - ADM 7   20.7     B.Elbow ADM 7.2  4.5  79.4 B.Elbow - Wrist 23 53 ?49 20.9     A.Elbow ADM 9.2  4.1  91.7 A.Elbow - B.Elbow 10 49 ?49 18.7         A.Elbow - Wrist      R Peroneal - EDB     Ankle EDB 12.2 ?6.5 0.2 ?2.0 100 Ankle - EDB 9   0.6     Fib head EDB 25.7  0.1  74.8 Fib head - Ankle 31 23 ?44 0.4     Pop fossa EDB 32.9  0.1  65.3 Pop fossa - Fib head 10 14 ?44 0.2         Pop fossa - Ankle      L Peroneal - EDB     Ankle EDB 6.1 ?6.5 1.2 ?2.0 100 Ankle - EDB 9   3.5     Fib head EDB 13.6  1.1  89.3 Fib head - Ankle 31 41 ?44 3.7  Pop fossa EDB 16.1  0.9  86.7 Pop fossa - Fib head 10 40 ?44 3.5         Pop fossa - Ankle      R Tibial - AH     Ankle AH 6.1 ?5.8 2.0 ?4.0 100 Ankle - AH 9   9.0     Pop fossa AH 17.1  1.6  77.5 Pop fossa - Ankle 39 36 ?41 8.1  L Tibial - AH     Ankle AH 4.1 ?5.8 5.6 ?4.0 100 Ankle - AH 9   12.8     Pop fossa AH 12.9  3.6  63.9 Pop fossa - Ankle 39 44 ?41 11.2                      SNC    Nerve / Sites Rec. Site Peak Lat Ref.  Amp Ref. Segments Distance    ms ms V V  cm  R Radial - Anatomical snuff box (Forearm)     Forearm Wrist 2.4 ?2.9 16 ?15 Forearm - Wrist 10  R Sural - Ankle (Calf)     Calf Ankle 4.1 ?4.4 7 ?6 Calf - Ankle 14  L Sural - Ankle (Calf)     Calf  Ankle 3.9 ?4.4 10 ?6 Calf - Ankle 14  R Superficial peroneal - Ankle     Lat leg Ankle 4.4 ?4.4 7 ?6 Lat leg - Ankle 14  L Superficial peroneal - Ankle     Lat leg Ankle 4.1 ?4.4 6 ?6 Lat leg - Ankle 14  R Median - Orthodromic (Dig II, Mid palm)     Dig II Wrist 3.3 ?3.4 16 ?10 Dig II - Wrist 13  R Ulnar - Orthodromic, (Dig V, Mid palm)     Dig V Wrist 2.8 ?3.1 7 ?5 Dig V - Wrist 53                   F  Wave    Nerve F Lat Ref.   ms ms  R Peroneal - EDB NR ?56.0  R Tibial - AH 76.1 ?56.0  R Median - APB 43.2 ?31.0  R Ulnar - ADM 31.2 ?32.0  L Peroneal - EDB 57.3 ?56.0  L Tibial - AH 53.2 ?56.0                 EMG full       EMG Summary Table    Spontaneous MUAP Recruitment  Muscle IA Fib PSW Fasc Other Amp Dur. Poly Pattern  R. Deltoid Normal 1+ None Rare _______ Normal Normal Normal Reduced  R. Biceps brachii Normal 1+ None Occasional _______ Increased Increased 2+ Reduced  R. Triceps brachii Normal 1+ None Occasional _______ Increased Normal Normal Reduced  R. Flexor carpi radialis Normal None None Occasional _______ Normal Normal 1+ Reduced  R. First dorsal interosseous Normal 1+ None Occasional _______ Normal Normal Normal Normal  R. Vastus medialis Normal 1+ 1+ None _______ Normal Normal Normal Normal  R. Tibialis anterior Normal None 2+ Rare _______ Normal Normal Normal Reduced  R. Gastrocnemius (Medial head) Normal None 2+ None _______ Normal Normal Normal Reduced  R. Thoracic paraspinals Normal None None Rare _______ Normal Normal Normal Normal  R. Cervical paraspinals Normal None None Rare _______ Normal Normal Normal Normal  R. Genioglossus Normal None None None _______ Normal Normal Normal Normal

## 2018-11-11 ENCOUNTER — Telehealth: Payer: Self-pay | Admitting: Diagnostic Neuroimaging

## 2018-11-11 NOTE — Telephone Encounter (Signed)
Pt is asking to be called about the scheduling of his MRI and Spinal Tap.  Pt made aware that RN Leilani Able is not in office but that a message would be sent for him to be called next week

## 2018-11-11 NOTE — Telephone Encounter (Signed)
[employer] Auth: [id number] 9exp. 11/11/18 to 12/26/18) order sent to GI. They will reach out to the patient to schedule.

## 2018-11-15 NOTE — Telephone Encounter (Signed)
Spoke with patient and informed him his MRI is scheduled for 9/16; he was aware. He stated his family is asking if it can be done sooner. I advised he call [facility] and ask them. He stated he had and was told he had to call daily to see if there was a cancellation.  I advised him that Roberta from GI will call him to schedule spinal tap. I advised if he hasn't gotten a call in a few days to call GI or call here and speak with Hinton Dyer C. He verbalized understanding, appreciation.

## 2018-11-18 ENCOUNTER — Other Ambulatory Visit: Payer: Self-pay

## 2018-11-18 ENCOUNTER — Ambulatory Visit
Admission: RE | Admit: 2018-11-18 | Discharge: 2018-11-18 | Disposition: A | Discharge status: Home / Self Care | Payer: 59 | Source: Ambulatory Visit | Attending: Diagnostic Neuroimaging | Admitting: Diagnostic Neuroimaging

## 2018-11-18 ENCOUNTER — Other Ambulatory Visit (HOSPITAL_COMMUNITY)
Admission: RE | Admit: 2018-11-18 | Discharge: 2018-11-18 | Disposition: A | Discharge status: Home / Self Care | Payer: 59 | Source: Ambulatory Visit | Attending: Diagnostic Neuroimaging | Admitting: Diagnostic Neuroimaging

## 2018-11-18 VITALS — BP 109/74 | HR 64

## 2018-11-18 DIAGNOSIS — R2689 Other abnormalities of gait and mobility: Secondary | ICD-10-CM | POA: Diagnosis present

## 2018-11-18 DIAGNOSIS — M6281 Muscle weakness (generalized): Secondary | ICD-10-CM | POA: Diagnosis not present

## 2018-11-18 DIAGNOSIS — R269 Unspecified abnormalities of gait and mobility: Secondary | ICD-10-CM | POA: Diagnosis present

## 2018-11-18 NOTE — Discharge Instructions (Signed)

## 2018-11-18 NOTE — Progress Notes (Signed)
Blood obtained from pt's R AC for LP labs. Pt tolerated procedure well. Site is unremarkable.  

## 2018-11-22 ENCOUNTER — Telehealth: Payer: Self-pay | Admitting: *Deleted

## 2018-11-22 ENCOUNTER — Telehealth: Payer: Self-pay | Admitting: Diagnostic Neuroimaging

## 2018-11-22 NOTE — Telephone Encounter (Signed)
Pt states that his headaches are not as bad as they have been but he is still having headaches from the spinal tap on Thursday of last week.  Please call

## 2018-11-22 NOTE — Telephone Encounter (Signed)
Spoke with patient and informed him his labs are okay except: CK 483 (high) and CSF protein high (115) from the LP. [name] he may consider empiric trial of IVIG (for possible CIDP) and also consider referral to academic [facility] [place] [facility] ([facility], [facility] or [facility]).  The patient asked if he still needs MRI cervical spine. [name] I advised he does; it is scheduled for 12/08/18. He stated he will wait until all results are in and also talk to his daughter before deciding next steps. I advised he call for any questions. He verbalized understanding, appreciation. Anthony Booth

## 2018-11-22 NOTE — Telephone Encounter (Addendum)
Spoke with patient who stated today his headache is improved. Laying down has helped, ibuprofen didn't help.  I advised he take tylenol, drink plenty of water, and some caffeine. He stated he drinks lots of coffee and Mtn Dew. I advised he not drink a lot of those but drink water too.  He then asked about his lab results, from 11/10/18 and LP. I advised will discuss with Dr Leta Baptist and call him back with results.  He will monitor his headache, and he verbalized understanding, appreciation. He did report that his walking is better.

## 2018-11-24 LAB — CBC WITH DIFFERENTIAL/PLATELET
Basophils Absolute: 0.1 10*3/uL (ref 0.0–0.2)
Basos: 1 %
EOS (ABSOLUTE): 0.3 10*3/uL (ref 0.0–0.4)
Eos: 4 %
Hematocrit: 43.9 % (ref 37.5–51.0)
Hemoglobin: 14.8 g/dL (ref 13.0–17.7)
Immature Grans (Abs): 0 10*3/uL (ref 0.0–0.1)
Immature Granulocytes: 0 %
Lymphocytes Absolute: 2.2 10*3/uL (ref 0.7–3.1)
Lymphs: 32 %
MCH: 30.4 pg (ref 26.6–33.0)
MCHC: 33.7 g/dL (ref 31.5–35.7)
MCV: 90 fL (ref 79–97)
Monocytes Absolute: 0.4 10*3/uL (ref 0.1–0.9)
Monocytes: 6 %
Neutrophils Absolute: 4 10*3/uL (ref 1.4–7.0)
Neutrophils: 57 %
Platelets: 295 10*3/uL (ref 150–450)
RBC: 4.87 x10E6/uL (ref 4.14–5.80)
RDW: 12.9 % (ref 11.6–15.4)
WBC: 7 10*3/uL (ref 3.4–10.8)

## 2018-11-24 LAB — CK: Total CK: 483 U/L — ABNORMAL HIGH (ref 41–331)

## 2018-11-24 LAB — ALDOLASE: Aldolase: 7 U/L (ref 3.3–10.3)

## 2018-11-24 LAB — VITAMIN B12: Vitamin B-12: 271 pg/mL (ref 232–1245)

## 2018-11-24 LAB — ANA,IFA RA DIAG PNL W/RFLX TIT/PATN
ANA Titer 1: NEGATIVE
Cyclic Citrullin Peptide Ab: 6 units (ref 0–19)
Rheumatoid fact SerPl-aCnc: <10 IU/mL (ref 0.0–13.9)

## 2018-11-24 LAB — GM1 IGG AUTOANTIBODIES: GM1 IgG Autoantibodies: <5 % (ref 0–30)

## 2018-11-24 LAB — INTERPRETATION

## 2018-11-24 LAB — ANGIOTENSIN CONVERTING ENZYME: Angio Convert Enzyme: 25 U/L (ref 14–82)

## 2018-11-24 LAB — LYME AB/WESTERN BLOT REFLEX
LYME DISEASE AB, QUANT, IGM: <0.8 index (ref 0.00–0.79)
Lyme IgG/IgM Ab: <0.91 {ISR} (ref 0.00–0.90)

## 2018-11-24 LAB — HIV ANTIBODY (ROUTINE TESTING W REFLEX): HIV Screen 4th Generation wRfx: NONREACTIVE

## 2018-11-24 LAB — PAN-ANCA
ANCA Proteinase 3: <3.5 U/mL (ref 0.0–3.5)
Atypical pANCA: <1:20 {titer}
C-ANCA: <1:20 {titer}
Myeloperoxidase Ab: <9 U/mL (ref 0.0–9.0)
P-ANCA: <1:20 {titer}

## 2018-11-24 LAB — GM1 IGM AUTOANTIBODIES: GM1 IgM Autoantibodies: <5 % (ref 0–30)

## 2018-11-24 LAB — MUSK ANTIBODIES: MuSK Antibodies: <1 U/mL

## 2018-11-24 LAB — MAG IGM ANTIBODIES: MAG IgM Antibodies: <900 BTU (ref 0–999)

## 2018-11-24 LAB — MAG INTERPRETATION REFLEXED

## 2018-11-24 LAB — TSH: TSH: 0.953 u[IU]/mL (ref 0.450–4.500)

## 2018-11-24 LAB — ACHR ABS WITH REFLEX TO MUSK: AChR Binding Ab, Serum: 0.03 nmol/L (ref 0.00–0.24)

## 2018-11-26 ENCOUNTER — Telehealth: Payer: Self-pay | Admitting: Diagnostic Neuroimaging

## 2018-11-26 LAB — CSF CELL COUNT WITH DIFFERENTIAL
RBC Count, CSF: 0 cells/uL (ref 0–10)
WBC, CSF: 0 cells/uL (ref 0–5)

## 2018-11-26 LAB — OLIGOCLONAL BANDS, CSF + SERM

## 2018-11-26 LAB — CSF CULTURE W GRAM STAIN
MICRO NUMBER:: 818686
Result:: NO GROWTH
SPECIMEN QUALITY:: ADEQUATE

## 2018-11-26 LAB — GLUCOSE, CSF: Glucose, CSF: 62 mg/dL (ref 40–80)

## 2018-11-26 LAB — PROTEIN, CSF: Total Protein, CSF: 115 mg/dL — ABNORMAL HIGH (ref 15–45)

## 2018-11-26 NOTE — Telephone Encounter (Signed)
Pt called wanting the RN or provider to explain his lab results to him. Please advise.

## 2018-11-30 NOTE — Telephone Encounter (Signed)
[name] called patient and advised Dr Leta Baptist would like to have video visit with him and his daughter. I advised Dr Doyce Loose will be out of the office the week of Sept 14th. He stated that he doesn't want to wait two weeks. I advised will discuss with Dr Leta Baptist adding him on this Thurs, will call him back. He verbalized understanding, appreciation.

## 2018-12-01 NOTE — Telephone Encounter (Signed)
Received call back from patient. He stated his family wants in office visit. His wife and daughter will come. We scheduled for next Mon. He is aware to wear masks, arrive 30 minutes early and bring insurance information. Patient verbalized understanding, appreciation.

## 2018-12-01 NOTE — Telephone Encounter (Signed)
LVM requesting call back to schedule follow up. I informed him Dr Leta Baptist will be in the office next Mon, Tues. Left number.

## 2018-12-06 ENCOUNTER — Encounter: Payer: Self-pay | Admitting: Diagnostic Neuroimaging

## 2018-12-06 ENCOUNTER — Ambulatory Visit: Payer: BC Managed Care – PPO | Admitting: Diagnostic Neuroimaging

## 2018-12-06 ENCOUNTER — Other Ambulatory Visit: Payer: Self-pay

## 2018-12-06 VITALS — BP 100/66 | HR 78 | Temp 97.8°F | Ht 70.0 in | Wt 132.6 lb

## 2018-12-06 DIAGNOSIS — R269 Unspecified abnormalities of gait and mobility: Secondary | ICD-10-CM | POA: Diagnosis not present

## 2018-12-06 DIAGNOSIS — R471 Dysarthria and anarthria: Secondary | ICD-10-CM

## 2018-12-06 DIAGNOSIS — G6181 Chronic inflammatory demyelinating polyneuritis: Secondary | ICD-10-CM

## 2018-12-06 DIAGNOSIS — G1220 Motor neuron disease, unspecified: Secondary | ICD-10-CM

## 2018-12-06 DIAGNOSIS — M6281 Muscle weakness (generalized): Secondary | ICD-10-CM

## 2018-12-06 NOTE — Patient Instructions (Addendum)
-   follow up MRI cervical spine  - trial of IVIG (medication)  - will request second opinion at academic center [place] [facility])  - home therapies --> PT, OT, ST eval (caution with gait, swallowing, driving)  - use cane; use mechanical soft foods / puree
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
## 2018-12-07 ENCOUNTER — Telehealth: Payer: Self-pay | Admitting: Diagnostic Neuroimaging

## 2018-12-07 NOTE — Telephone Encounter (Signed)
Pt is asking for a call from RN to discuss missing time from work

## 2018-12-07 NOTE — Telephone Encounter (Addendum)
Called patient who stated he and is family think he may need to be on medical leave or short term disability from work. He asked what to do. I advised he talk to his manager, HR personnel and find out what is required. I informed him of our office's $50 fee per form, 2 weeks to complete all forms. I advised he bring in the forms to medical records then I will receive them on my desk. He verbalized understanding, appreciation.

## 2018-12-08 ENCOUNTER — Other Ambulatory Visit: Payer: Self-pay

## 2018-12-08 DIAGNOSIS — Z0289 Encounter for other administrative examinations: Secondary | ICD-10-CM

## 2018-12-09 ENCOUNTER — Other Ambulatory Visit: Payer: Self-pay

## 2018-12-09 NOTE — Telephone Encounter (Signed)
Patient changed his insurance. [employer] Auth: [id number] (exp. 12/09/18 to 06/06/19) patient is scheduled at [facility] for 12/29/18.

## 2018-12-09 NOTE — Telephone Encounter (Signed)
Thayer [employer]ings [employer](the HR manager @ [employer] and friend to pt) has called with pt in her office.  I informed her that she is not on DPR and it is not likely RN will be able to call her back.  Thayer Headings has called re: the  disability paperwork that is needed so pt can apply for disability.  She stated that pt dropped it off and paid the fee.  Thayer Headings was advised of there being an allowance of 2 weeks for the completion of that type request.  During call Thayer Headings stated pt stepped out room. She then shared that she is trying desperately to help him because he has a neurological problem and should not be working but she does not want him to be without income as to why she is trying in anyway that she can help to get him on disability. Thayer Headings states she has known pt over 20 years.  Thayer Headings states if it is possible RN can reach her Monday on her cell anytime @ [phone] her office # after 12 is [phone]

## 2018-12-10 ENCOUNTER — Ambulatory Visit: Payer: BC Managed Care – PPO | Admitting: Family Medicine

## 2018-12-10 ENCOUNTER — Encounter: Payer: Self-pay | Admitting: Family Medicine

## 2018-12-10 VITALS — BP 111/74 | HR 72 | Temp 96.9°F | Resp 16 | Ht 70.0 in | Wt 131.2 lb

## 2018-12-10 DIAGNOSIS — F339 Major depressive disorder, recurrent, unspecified: Secondary | ICD-10-CM | POA: Diagnosis not present

## 2018-12-10 DIAGNOSIS — I10 Essential (primary) hypertension: Secondary | ICD-10-CM

## 2018-12-10 DIAGNOSIS — E785 Hyperlipidemia, unspecified: Secondary | ICD-10-CM | POA: Diagnosis not present

## 2018-12-10 DIAGNOSIS — K219 Gastro-esophageal reflux disease without esophagitis: Secondary | ICD-10-CM | POA: Diagnosis not present

## 2018-12-10 MED ORDER — NAPROXEN 500 MG PO TABS: ORAL_TABLET | ORAL | 1 refills | Status: AC

## 2018-12-10 NOTE — Progress Notes (Signed)
BP 111/74   Pulse 72   Temp (!) 96.9 F (36.1 C) (Temporal)   Resp 16   Ht _0  (1.778 m)   Wt 131 lb 3.2 oz (59.5 kg)   SpO2 98%   BMI 18.83 kg/m    Subjective:   Patient ID: Anthony Booth, male    DOB: [birth year], 53 y.o.   MRN: [id number]  HPI: Anthony Booth is a 53 y.o. male presenting on 12/10/2018 for Hypertension (three month recheck) and Depression   HPI Hypertension Patient is currently on hydrochlorothiazide and verapamil, and their blood pressure today is 111/74. Patient denies any lightheadedness or dizziness. Patient denies headaches, blurred vision, chest pains, shortness of breath, or weakness. Denies any side effects from medication and is content with current medication.   Depression Patient says that he has increased depression and anxiety but a lot of that is stemming from his newly possible diagnosis of ALS that they are evaluating him for with neurology.  He says that has been stressful but he tries to take a positive outlook with it.  He denies any suicidal ideations or thoughts of hurting himself. Depression screen [facility] 2/9 12/10/2018 09/08/2018 05/26/2018 04/08/2018 03/08/2018  Decreased Interest 1 0 0 0 0  Down, Depressed, Hopeless 1 0 0 0 0  PHQ - 2 Score 2 0 0 0 0  Altered sleeping 0 - - - -  Tired, decreased energy 1 - - - -  Change in appetite 0 - - - -  Feeling bad or failure about yourself  2 - - - -  Trouble concentrating 0 - - - -  Moving slowly or fidgety/restless 3 - - - -  Suicidal thoughts 0 - - - -  PHQ-9 Score 8 - - - -    Patient has progressive muscle weakness and is under evaluation from neurology for possible ALS, he has scheduled MRI here soon.  Relevant past medical, surgical, family and social history reviewed and updated as indicated. Interim medical history since our last visit reviewed. Allergies and medications reviewed and updated.  Review of Systems  Constitutional: Negative for chills and fever.  Respiratory:  Negative for shortness of breath and wheezing.   Cardiovascular: Negative for chest pain and leg swelling.  Musculoskeletal: Positive for arthralgias and myalgias. Negative for back pain and gait problem.  Skin: Negative for rash.  Neurological: Positive for speech difficulty and weakness.  All other systems reviewed and are negative.   Per HPI unless specifically indicated above   Allergies as of 12/10/2018      Reactions   Lovenox [enoxaparin Sodium] Anxiety   Penicillins Nausea Only      Medication List       Accurate as of December 10, 2018  9:05 AM. If you have any questions, ask your nurse or doctor.        albuterol 108 (90 Base) MCG/ACT inhaler Commonly known as: VENTOLIN HFA Inhale 2 puffs into the lungs every 6 (six) hours as needed for wheezing or shortness of breath.   aspirin EC 81 MG tablet Take 81 mg by mouth daily.   atorvastatin 20 MG tablet Commonly known as: LIPITOR Take 1 tablet (20 mg total) by mouth daily.   baclofen 10 MG tablet Commonly known as: LIORESAL Take 1 tablet (10 mg total) by mouth 2 (two) times daily.   benzocaine 7.5 % oral gel Commonly known as: BABY ORAJEL Use as directed in the mouth or throat 3 (three) times daily as  needed for pain.   cetirizine 10 MG tablet Commonly known as: ZYRTEC Take 1 tablet (10 mg total) by mouth daily.   escitalopram 10 MG tablet Commonly known as: LEXAPRO Take 1 tablet (10 mg total) by mouth daily.   fluticasone 50 MCG/ACT nasal spray Commonly known as: FLONASE Place 1 spray into both nostrils 2 (two) times daily as needed for allergies or rhinitis.   hydrochlorothiazide 25 MG tablet Commonly known as: HYDRODIURIL Take 0.5 tablets (12.5 mg total) by mouth daily.   naproxen 500 MG tablet Commonly known as: NAPROSYN TAKE 1 TABLET BY MOUTH TWICE DAILY WITH A MEAL   nitroGLYCERIN 0.4 MG SL tablet Commonly known as: NITROSTAT Place 1 tablet (0.4 mg total) under the tongue every 5 (five)  minutes as needed for chest pain.   omeprazole 20 MG capsule Commonly known as: PRILOSEC Take 1 capsule by mouth once daily   verapamil 240 MG 24 hr capsule Commonly known as: VERELAN PM TAKE 1 CAPSULE BY MOUTH IN THE MORNING   vitamin C 500 MG tablet Commonly known as: ASCORBIC ACID Take 500 mg by mouth daily.   Vitamin D3 125 MCG (5000 UT) Tabs Take 5,000 Units by mouth daily.        Objective:   BP 111/74   Pulse 72   Temp (!) 96.9 F (36.1 C) (Temporal)   Resp 16   Ht _0  (1.778 m)   Wt 131 lb 3.2 oz (59.5 kg)   SpO2 98%   BMI 18.83 kg/m   Wt Readings from Last 3 Encounters:  12/10/18 131 lb 3.2 oz (59.5 kg)  12/06/18 132 lb 9.6 oz (60.1 kg)  09/08/18 146 lb 6.4 oz (66.4 kg)    Physical Exam Vitals signs and nursing note reviewed.  Constitutional:      General: He is not in acute distress.    Appearance: He is well-developed. He is not diaphoretic.  Eyes:     General: No scleral icterus.    Conjunctiva/sclera: Conjunctivae normal.  Neck:     Musculoskeletal: Neck supple.     Thyroid: No thyromegaly.  Cardiovascular:     Rate and Rhythm: Normal rate and regular rhythm.     Heart sounds: Normal heart sounds. No murmur.  Pulmonary:     Effort: Pulmonary effort is normal. No respiratory distress.     Breath sounds: Normal breath sounds. No wheezing.  Lymphadenopathy:     Cervical: No cervical adenopathy.  Skin:    General: Skin is warm and dry.     Findings: No rash.  Neurological:     Mental Status: He is alert and oriented to person, place, and time.     Motor: Weakness present.     Gait: Gait abnormal.  Psychiatric:        Behavior: Behavior normal.       Assessment & Plan:   Problem List Items Addressed This Visit      Cardiovascular and Mediastinum   Hypertension - Primary   Relevant Orders   CMP14+EGFR     Digestive   GERD (gastroesophageal reflux disease)     Other   Hyperlipidemia LDL goal <130   Depression, recurrent  ([place])      Patient will continue with neurology for progressive muscle weakness and deterioration and possible ALS. Follow up plan: Return in about 3 months (around 03/11/2019), or if symptoms worsen or fail to improve, for Hypertension and cholesterol and depression.  Counseling provided for all of the vaccine  components No orders of the defined types were placed in this encounter.   Caryl Pina, MD [facility] 12/10/2018, 9:05 AM

## 2018-12-11 LAB — CMP14+EGFR
ALT: 15 IU/L (ref 0–44)
AST: 20 IU/L (ref 0–40)
Albumin/Globulin Ratio: 2 (ref 1.2–2.2)
Albumin: 4.4 g/dL (ref 3.8–4.9)
Alkaline Phosphatase: 63 IU/L (ref 39–117)
BUN/Creatinine Ratio: 20 (ref 9–20)
BUN: 18 mg/dL (ref 6–24)
Bilirubin Total: 0.4 mg/dL (ref 0.0–1.2)
CO2: 27 mmol/L (ref 20–29)
Calcium: 9.7 mg/dL (ref 8.7–10.2)
Chloride: 98 mmol/L (ref 96–106)
Creatinine, Ser: 0.88 mg/dL (ref 0.76–1.27)
GFR calc Af Amer: 113 mL/min/{1.73_m2} (ref >59)
GFR calc non Af Amer: 97 mL/min/{1.73_m2} (ref >59)
Globulin, Total: 2.2 g/dL (ref 1.5–4.5)
Glucose: 79 mg/dL (ref 65–99)
Potassium: 4.4 mmol/L (ref 3.5–5.2)
Sodium: 139 mmol/L (ref 134–144)
Total Protein: 6.6 g/dL (ref 6.0–8.5)

## 2018-12-13 ENCOUNTER — Encounter: Payer: Self-pay | Admitting: *Deleted

## 2018-12-13 ENCOUNTER — Telehealth: Payer: Self-pay | Admitting: *Deleted

## 2018-12-13 DIAGNOSIS — K148 Other diseases of tongue: Secondary | ICD-10-CM | POA: Diagnosis not present

## 2018-12-13 DIAGNOSIS — R4781 Slurred speech: Secondary | ICD-10-CM | POA: Diagnosis not present

## 2018-12-13 DIAGNOSIS — E785 Hyperlipidemia, unspecified: Secondary | ICD-10-CM | POA: Diagnosis not present

## 2018-12-13 DIAGNOSIS — G4730 Sleep apnea, unspecified: Secondary | ICD-10-CM | POA: Diagnosis not present

## 2018-12-13 DIAGNOSIS — R1310 Dysphagia, unspecified: Secondary | ICD-10-CM | POA: Diagnosis not present

## 2018-12-13 DIAGNOSIS — G6181 Chronic inflammatory demyelinating polyneuritis: Secondary | ICD-10-CM | POA: Diagnosis not present

## 2018-12-13 DIAGNOSIS — F419 Anxiety disorder, unspecified: Secondary | ICD-10-CM | POA: Diagnosis not present

## 2018-12-13 DIAGNOSIS — G1220 Motor neuron disease, unspecified: Secondary | ICD-10-CM | POA: Diagnosis not present

## 2018-12-13 DIAGNOSIS — K219 Gastro-esophageal reflux disease without esophagitis: Secondary | ICD-10-CM | POA: Diagnosis not present

## 2018-12-13 DIAGNOSIS — M47815 Spondylosis without myelopathy or radiculopathy, thoracolumbar region: Secondary | ICD-10-CM | POA: Diagnosis not present

## 2018-12-13 DIAGNOSIS — R471 Dysarthria and anarthria: Secondary | ICD-10-CM | POA: Diagnosis not present

## 2018-12-13 DIAGNOSIS — K117 Disturbances of salivary secretion: Secondary | ICD-10-CM | POA: Diagnosis not present

## 2018-12-13 DIAGNOSIS — J45909 Unspecified asthma, uncomplicated: Secondary | ICD-10-CM | POA: Diagnosis not present

## 2018-12-13 DIAGNOSIS — F329 Major depressive disorder, single episode, unspecified: Secondary | ICD-10-CM | POA: Diagnosis not present

## 2018-12-13 DIAGNOSIS — M47812 Spondylosis without myelopathy or radiculopathy, cervical region: Secondary | ICD-10-CM | POA: Diagnosis not present

## 2018-12-13 DIAGNOSIS — R253 Fasciculation: Secondary | ICD-10-CM | POA: Diagnosis not present

## 2018-12-13 NOTE — Telephone Encounter (Addendum)
I called patient to advise him I had not been given the disability papers. He stated he brought them on 12/07/18, paid the fee. I advised will look for them and if I cannot find, I'll call Janice/HR. He verbalized understanding, appreciation. He asked that I speak with PT who was at house.  I spoke with Roselie Awkward PT who stated he has evaluated the patient. However Mr Stage doesn't qualify for in home PT because he is driving daily to work.  Roselie Awkward has taught him home exercises, and he stated if Dr Leta Baptist wants patient to receive out patient PT he can continue. Otherwise he will have to do home exercises Roselie Awkward taught him today. Arnold[employer] # [id number].  [employer] disability papers located, on Dr  [employer] for completion. Sent patient my chart to make him aware.

## 2018-12-13 NOTE — Telephone Encounter (Signed)
Received community message from [place] requesting PA for Panzyga for home infusion be done. Started PA on [facility], key: [id number]. Faxed recent office note, lab results, EMG/NCS to [facility] to attach to PA request.  Your information has been submitted to [place]. [employer] [place] will review the request and fax you a determination directly, typically within 3 business days of your submission once all necessary information is received. If [employer]  has not responded in 3 business days or if you have any questions about your submission, contact [place] at [phone].

## 2018-12-14 ENCOUNTER — Telehealth: Payer: Self-pay

## 2018-12-14 NOTE — Telephone Encounter (Signed)
Refer to previous phone note on disability form, dated 12/07/18.

## 2018-12-14 NOTE — Telephone Encounter (Signed)
[employer] disability form completed, signed, sent to medical records for processing. Notified patient via my chart.

## 2018-12-14 NOTE — Telephone Encounter (Signed)
I messaged patient that his disability form was sent to medical records. He replied requesting I call him. I called and LVM.

## 2018-12-14 NOTE — Telephone Encounter (Signed)
Received this message on CMM: This request has received a Cancelled outcome. This may mean either your patient does not have active coverage with this plan, this authorization was processed as a duplicate request, or an authorization was not needed for this medication. Per Carolynn Sayers RN with [facility]:  "I am calling his plan this a.m. with a question regarding the possible specialty plan use. I want to make sure this is a plan that will allow [facility] to provide/bill. He has a different type plan. We are able to provide as the medical plan but may not if he uses the specialty benefit if he has such." Replied to her letting her know CMM canceled my PA request .
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
## 2018-12-14 NOTE — Telephone Encounter (Signed)
Spoke with patient who asked what date of beginning of disability was put on his disability form. I advised him D r [place] listed Feb 2020 as this was inset of his symptoms. He asked for how long, and I advised for 12 months. I advised he can get a copy of form once it's processed. I updated him on PA for infusions and that Pam w/[facility] is working He verbalized understanding, appreciation.

## 2018-12-14 NOTE — Telephone Encounter (Signed)
Patient called wanting to know what the date was put on his disability paperwork.

## 2018-12-15 NOTE — Telephone Encounter (Addendum)
Received call from Winnie with [place] [facility] [place] who stated she wanted to help sort out authorization for patient's medication. She stated Privigen costs $6k so its cheaper than panzyga at $17 k. Both will require authorization request and need to come from a specialty pharmacy: [facility] are suggested. Call [phone] for authorization for privigen under medical part of insurance.  She will fax PA form in the event panzyga is prescribed instead of Privigen.  Called [employer] [place] [facility], spoke with care specialist, Park Breed to start PA case for [place],  [phone] billing code. Medicine has to go through his medical benefit, not pharmacy benefit. .She stated to fax clinicals to [phone], ref# [id number] and note he must be infused at home-homebound. This was done.

## 2018-12-15 NOTE — Telephone Encounter (Signed)
Received call from Carolynn Sayers, RN with [facility]. I updated her with my conversation with [employer] today. She stated that they will not be able to do his home infusions if drug has to come from a specialty pharmacy as they are a pharmacy themselves. She is going to call patient, an I requested she tell hm I have sent in PA to [facility], was told it may take up to 72 hours for decision on whether privieggn is approved. Pam verbalized understanding, appreciation. Marland Kitchen

## 2018-12-16 NOTE — Telephone Encounter (Signed)
Received fax from [facility], [place] [facility] [place] request for services, placed on Dr [employer] desk for signature.

## 2018-12-16 NOTE — Telephone Encounter (Signed)
Received call from [facility] w/[facility] who stated they will be able to infuse patient in his home because it'll be billed through his medical. They have a PA for panzyga instead of Privigen because it's less due to large rebate he can get. He'll still have to meet his deductible, and she's calling patient today to discuss this with him including a payment plan. She will fax PA for Dr Leta Baptist to sign.

## 2018-12-21 NOTE — Telephone Encounter (Signed)
Dr Leta Baptist signed request for services and it was faxed back to [facility] specialty infusion services) on 12/16/18. Received fax from [place], Josem Kaufmann notice for  home infusion of Privigen 500 mg, effective 12/21/2018, 13 visits. Ref #[id number]. Per Carolynn Sayers, RN [facility], the patient will receive infusions through their group. Sent Pam message re: does she need copy of auth notice.

## 2018-12-22 ENCOUNTER — Other Ambulatory Visit: Payer: Self-pay | Admitting: Family Medicine

## 2018-12-22 NOTE — Telephone Encounter (Signed)
Anthony Booth called stating that [employer] will be calling for the IVIG Auth for the Panzyga and not the Privigen. She states the Panzyga is cheaper and more cost effective for the pt. Please call back if you have anymore questions. [phone]

## 2018-12-22 NOTE — Telephone Encounter (Signed)
Called Pam who stated they called [employer] this morning.[employer] needs clarification form physician whether patient is to receive Panzyga or Privigen because an Josem Kaufmann has been submitted for both. Pam stated they want him to receive PAnzyga because it will be cheaper in the end due to large rebate. I advised her I had returned [place] with [place] call and LVM.

## 2018-12-22 NOTE — Telephone Encounter (Signed)
Received message from [facility] with [place] to fax over [facility]. Faxed it over, sent her message.

## 2018-12-22 NOTE — Telephone Encounter (Signed)
Donald Prose from [place] is requesting a call back concerning order   CB# [phone]

## 2018-12-22 NOTE — Telephone Encounter (Signed)
Returned call to [place], LVM requesting call back.

## 2018-12-23 NOTE — Telephone Encounter (Signed)
[place] who's asking which IVIG Dr Leta Baptist wants for patient because they have current auth for privigen approved till 03/22/2019 for  Home infusions through  [place]. I informed her that Dr Leta Baptist had ordered Panzyga however [place] had advised Privigen was less expensive, so that is drug I got approved. However [place] ([facility]) advised they could infuse Panzyga for less expense than Privigen. Donald Prose stated she would cancel privigen and begin auth for Panzyga. The decision turn around is  72 business hrs. She will fax approval to this RN and [facility] ([facility]). I will message Pam ,RN with [facility].

## 2018-12-27 ENCOUNTER — Encounter: Payer: Self-pay | Admitting: *Deleted

## 2018-12-28 NOTE — Telephone Encounter (Signed)
Received fax from [facility] re: IVIG Non- Lyophilized approved 12/20/18- 03/20/2019. Ref #[id number], place of service: home. Faxed a copy to Carolynn Sayers RN with [facility] infusion and sent her community message. Sent patient a message via my chart.

## 2018-12-29 ENCOUNTER — Ambulatory Visit
Admission: RE | Admit: 2018-12-29 | Discharge: 2018-12-29 | Disposition: A | Discharge status: Home / Self Care | Payer: BC Managed Care – PPO | Source: Ambulatory Visit | Attending: Diagnostic Neuroimaging | Admitting: Diagnostic Neuroimaging

## 2018-12-29 ENCOUNTER — Other Ambulatory Visit: Payer: Self-pay

## 2018-12-29 DIAGNOSIS — M6281 Muscle weakness (generalized): Secondary | ICD-10-CM

## 2018-12-29 MED ORDER — GADOBENATE DIMEGLUMINE 529 MG/ML IV SOLN
12.0000 mL | Freq: Once | INTRAVENOUS | Status: AC | PRN
  Administered 2018-12-29: 15:00:00 12 mL via INTRAVENOUS

## 2018-12-30 ENCOUNTER — Encounter: Payer: Self-pay | Admitting: Family Medicine

## 2019-01-03 DIAGNOSIS — G1220 Motor neuron disease, unspecified: Secondary | ICD-10-CM | POA: Diagnosis not present

## 2019-01-03 DIAGNOSIS — G6181 Chronic inflammatory demyelinating polyneuritis: Secondary | ICD-10-CM | POA: Diagnosis not present

## 2019-01-04 ENCOUNTER — Telehealth: Payer: Self-pay | Admitting: *Deleted

## 2019-01-04 DIAGNOSIS — G6181 Chronic inflammatory demyelinating polyneuritis: Secondary | ICD-10-CM | POA: Diagnosis not present

## 2019-01-04 DIAGNOSIS — G1220 Motor neuron disease, unspecified: Secondary | ICD-10-CM | POA: Diagnosis not present

## 2019-01-04 NOTE — Telephone Encounter (Signed)
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 Dr Leta Baptist advises to continue his current treatment plan. He stated he received his first Panzyga infusion last night. He has appt at [facility] 01/27/19. I advised he call for any concerns, questions. He  verbalized understanding, appreciation.

## 2019-01-04 NOTE — Telephone Encounter (Signed)
Received fax from [place] Ridgel, [place] re: Panzyga orders. Dr Leta Baptist completed and orders faxed back. Received fax from [facility] re: Panzyga infusions. Orders signed by Dr Leta Baptist and faxed back.

## 2019-01-05 DIAGNOSIS — G6181 Chronic inflammatory demyelinating polyneuritis: Secondary | ICD-10-CM | POA: Diagnosis not present

## 2019-01-05 DIAGNOSIS — G1220 Motor neuron disease, unspecified: Secondary | ICD-10-CM | POA: Diagnosis not present

## 2019-01-06 DIAGNOSIS — G6181 Chronic inflammatory demyelinating polyneuritis: Secondary | ICD-10-CM | POA: Diagnosis not present

## 2019-01-06 DIAGNOSIS — G1220 Motor neuron disease, unspecified: Secondary | ICD-10-CM | POA: Diagnosis not present

## 2019-01-07 DIAGNOSIS — G6181 Chronic inflammatory demyelinating polyneuritis: Secondary | ICD-10-CM | POA: Diagnosis not present

## 2019-01-07 DIAGNOSIS — G1220 Motor neuron disease, unspecified: Secondary | ICD-10-CM | POA: Diagnosis not present

## 2019-01-12 ENCOUNTER — Encounter: Payer: Self-pay | Admitting: *Deleted

## 2019-01-12 NOTE — Telephone Encounter (Signed)
Received community message from Carolynn Sayers, RN re: patient's BP was low, and he fell in yard. Pam advised patient to drink plenty of water.  I messaged Pam with Dr Gladstone Lighter reply : "Hold off on IVIG. Check BP regularly. Try to drink more fluids. May use thickener powder with fluids if needed." Sent my chart to patient today with Dr Gladstone Lighter recommendations.

## 2019-01-27 DIAGNOSIS — R1310 Dysphagia, unspecified: Secondary | ICD-10-CM | POA: Diagnosis not present

## 2019-01-27 DIAGNOSIS — R471 Dysarthria and anarthria: Secondary | ICD-10-CM | POA: Diagnosis not present

## 2019-01-27 DIAGNOSIS — R253 Fasciculation: Secondary | ICD-10-CM | POA: Diagnosis not present

## 2019-01-27 DIAGNOSIS — Z880 Allergy status to penicillin: Secondary | ICD-10-CM | POA: Diagnosis not present

## 2019-01-27 DIAGNOSIS — T50905A Adverse effect of unspecified drugs, medicaments and biological substances, initial encounter: Secondary | ICD-10-CM | POA: Diagnosis not present

## 2019-01-27 DIAGNOSIS — M6250 Muscle wasting and atrophy, not elsewhere classified, unspecified site: Secondary | ICD-10-CM | POA: Diagnosis not present

## 2019-01-27 DIAGNOSIS — Z888 Allergy status to other drugs, medicaments and biological substances status: Secondary | ICD-10-CM | POA: Diagnosis not present

## 2019-01-27 DIAGNOSIS — R531 Weakness: Secondary | ICD-10-CM | POA: Diagnosis not present

## 2019-01-27 DIAGNOSIS — R233 Spontaneous ecchymoses: Secondary | ICD-10-CM | POA: Diagnosis not present

## 2019-02-09 DIAGNOSIS — G1221 Amyotrophic lateral sclerosis: Secondary | ICD-10-CM | POA: Diagnosis not present

## 2019-02-09 DIAGNOSIS — R1312 Dysphagia, oropharyngeal phase: Secondary | ICD-10-CM | POA: Diagnosis not present

## 2019-02-09 DIAGNOSIS — Z7409 Other reduced mobility: Secondary | ICD-10-CM | POA: Diagnosis not present

## 2019-02-09 DIAGNOSIS — R531 Weakness: Secondary | ICD-10-CM | POA: Diagnosis not present

## 2019-02-10 ENCOUNTER — Other Ambulatory Visit: Payer: Self-pay | Admitting: Family Medicine

## 2019-02-10 DIAGNOSIS — F17200 Nicotine dependence, unspecified, uncomplicated: Secondary | ICD-10-CM

## 2019-02-21 ENCOUNTER — Other Ambulatory Visit: Payer: Self-pay | Admitting: Family Medicine

## 2019-02-23 MED ORDER — BACLOFEN 10 MG PO TABS: 10.0000 mg | ORAL_TABLET | Freq: Two times a day (BID) | ORAL | 0 refills | Status: DC

## 2019-02-23 NOTE — Telephone Encounter (Signed)
refill failed. resent 

## 2019-02-23 NOTE — Addendum Note (Signed)
Addended by: Antonietta Barcelona D on: 02/23/2019 09:54 AM   Modules accepted: Orders

## 2019-02-24 DIAGNOSIS — G1221 Amyotrophic lateral sclerosis: Secondary | ICD-10-CM | POA: Diagnosis not present

## 2019-02-24 DIAGNOSIS — Z20828 Contact with and (suspected) exposure to other viral communicable diseases: Secondary | ICD-10-CM | POA: Diagnosis not present

## 2019-02-24 DIAGNOSIS — Z01812 Encounter for preprocedural laboratory examination: Secondary | ICD-10-CM | POA: Diagnosis not present

## 2019-02-27 ENCOUNTER — Encounter: Payer: Self-pay | Admitting: Family Medicine

## 2019-02-28 ENCOUNTER — Other Ambulatory Visit: Payer: Self-pay

## 2019-02-28 MED ORDER — VERAPAMIL HCL ER 240 MG PO TBCR: 240.0000 mg | EXTENDED_RELEASE_TABLET | Freq: Every day | ORAL | 1 refills | Status: AC

## 2019-03-01 DIAGNOSIS — I517 Cardiomegaly: Secondary | ICD-10-CM | POA: Diagnosis not present

## 2019-03-01 DIAGNOSIS — Z01810 Encounter for preprocedural cardiovascular examination: Secondary | ICD-10-CM | POA: Diagnosis not present

## 2019-03-01 DIAGNOSIS — I498 Other specified cardiac arrhythmias: Secondary | ICD-10-CM | POA: Diagnosis not present

## 2019-03-03 DIAGNOSIS — I10 Essential (primary) hypertension: Secondary | ICD-10-CM | POA: Diagnosis not present

## 2019-03-03 DIAGNOSIS — I2510 Atherosclerotic heart disease of native coronary artery without angina pectoris: Secondary | ICD-10-CM | POA: Diagnosis not present

## 2019-03-03 DIAGNOSIS — E785 Hyperlipidemia, unspecified: Secondary | ICD-10-CM | POA: Diagnosis not present

## 2019-03-03 DIAGNOSIS — R1310 Dysphagia, unspecified: Secondary | ICD-10-CM | POA: Diagnosis not present

## 2019-03-03 DIAGNOSIS — Z79899 Other long term (current) drug therapy: Secondary | ICD-10-CM | POA: Diagnosis not present

## 2019-03-03 DIAGNOSIS — Z7982 Long term (current) use of aspirin: Secondary | ICD-10-CM | POA: Diagnosis not present

## 2019-03-03 DIAGNOSIS — K228 Other specified diseases of esophagus: Secondary | ICD-10-CM | POA: Diagnosis not present

## 2019-03-03 DIAGNOSIS — F17210 Nicotine dependence, cigarettes, uncomplicated: Secondary | ICD-10-CM | POA: Diagnosis not present

## 2019-03-03 DIAGNOSIS — R634 Abnormal weight loss: Secondary | ICD-10-CM | POA: Diagnosis not present

## 2019-03-03 DIAGNOSIS — G1221 Amyotrophic lateral sclerosis: Secondary | ICD-10-CM | POA: Diagnosis not present

## 2019-03-03 DIAGNOSIS — K219 Gastro-esophageal reflux disease without esophagitis: Secondary | ICD-10-CM | POA: Diagnosis not present

## 2019-03-04 DIAGNOSIS — Z931 Gastrostomy status: Secondary | ICD-10-CM | POA: Diagnosis not present

## 2019-03-04 DIAGNOSIS — I2510 Atherosclerotic heart disease of native coronary artery without angina pectoris: Secondary | ICD-10-CM | POA: Diagnosis not present

## 2019-03-04 DIAGNOSIS — E785 Hyperlipidemia, unspecified: Secondary | ICD-10-CM | POA: Diagnosis not present

## 2019-03-04 DIAGNOSIS — R1310 Dysphagia, unspecified: Secondary | ICD-10-CM | POA: Diagnosis not present

## 2019-03-04 DIAGNOSIS — F17210 Nicotine dependence, cigarettes, uncomplicated: Secondary | ICD-10-CM | POA: Diagnosis not present

## 2019-03-04 DIAGNOSIS — I10 Essential (primary) hypertension: Secondary | ICD-10-CM | POA: Diagnosis not present

## 2019-03-04 DIAGNOSIS — G1221 Amyotrophic lateral sclerosis: Secondary | ICD-10-CM | POA: Diagnosis not present

## 2019-03-04 DIAGNOSIS — Z79899 Other long term (current) drug therapy: Secondary | ICD-10-CM | POA: Diagnosis not present

## 2019-03-04 DIAGNOSIS — Z7982 Long term (current) use of aspirin: Secondary | ICD-10-CM | POA: Diagnosis not present

## 2019-03-04 DIAGNOSIS — R9389 Abnormal findings on diagnostic imaging of other specified body structures: Secondary | ICD-10-CM | POA: Diagnosis not present

## 2019-03-04 HISTORY — DX: Gastrostomy status: Z93.1

## 2019-03-05 DIAGNOSIS — Z7982 Long term (current) use of aspirin: Secondary | ICD-10-CM | POA: Diagnosis not present

## 2019-03-05 DIAGNOSIS — G1221 Amyotrophic lateral sclerosis: Secondary | ICD-10-CM | POA: Diagnosis not present

## 2019-03-05 DIAGNOSIS — I10 Essential (primary) hypertension: Secondary | ICD-10-CM | POA: Diagnosis not present

## 2019-03-05 DIAGNOSIS — E785 Hyperlipidemia, unspecified: Secondary | ICD-10-CM | POA: Diagnosis not present

## 2019-03-05 DIAGNOSIS — F17210 Nicotine dependence, cigarettes, uncomplicated: Secondary | ICD-10-CM | POA: Diagnosis not present

## 2019-03-05 DIAGNOSIS — Z79899 Other long term (current) drug therapy: Secondary | ICD-10-CM | POA: Diagnosis not present

## 2019-03-05 DIAGNOSIS — R1310 Dysphagia, unspecified: Secondary | ICD-10-CM | POA: Diagnosis not present

## 2019-03-05 DIAGNOSIS — I2510 Atherosclerotic heart disease of native coronary artery without angina pectoris: Secondary | ICD-10-CM | POA: Diagnosis not present

## 2019-03-07 ENCOUNTER — Other Ambulatory Visit: Payer: Self-pay

## 2019-03-07 ENCOUNTER — Encounter: Payer: Self-pay | Admitting: Diagnostic Neuroimaging

## 2019-03-07 ENCOUNTER — Ambulatory Visit: Payer: BC Managed Care – PPO | Admitting: Diagnostic Neuroimaging

## 2019-03-07 VITALS — BP 113/72 | HR 78 | Temp 97.8°F | Ht 70.0 in | Wt 107.4 lb

## 2019-03-07 DIAGNOSIS — G1221 Amyotrophic lateral sclerosis: Secondary | ICD-10-CM | POA: Diagnosis not present
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
## 2019-03-14 ENCOUNTER — Ambulatory Visit (INDEPENDENT_AMBULATORY_CARE_PROVIDER_SITE_OTHER): Payer: BC Managed Care – PPO | Admitting: Family Medicine

## 2019-03-14 ENCOUNTER — Encounter: Payer: Self-pay | Admitting: Family Medicine

## 2019-03-14 DIAGNOSIS — I10 Essential (primary) hypertension: Secondary | ICD-10-CM

## 2019-03-14 DIAGNOSIS — K219 Gastro-esophageal reflux disease without esophagitis: Secondary | ICD-10-CM | POA: Diagnosis not present

## 2019-03-14 DIAGNOSIS — E785 Hyperlipidemia, unspecified: Secondary | ICD-10-CM

## 2019-03-14 NOTE — Progress Notes (Signed)
Virtual Visit via telephone Note  I connected with Anthony Booth on 03/14/19 at [place] by telephone and verified that I am speaking with the correct person using two identifiers. Dekari Sutch is currently located at home and wife Inez Catalina are currently with her during visit. The provider, Fransisca Kaufmann Synai Prettyman, MD is located in their office at time of visit.  Call ended at [phone]  I discussed the limitations, risks, security and privacy concerns of performing an evaluation and management service by telephone and the availability of in person appointments. I also discussed with the patient that there may be a patient responsible charge related to this service. The patient expressed understanding and agreed to proceed.   History and Present Illness: Hypertension Patient is currently on hctz and verapamil, and their blood pressure today is 113/72. Patient denies any lightheadedness or dizziness. Patient denies headaches, blurred vision, chest pains, shortness of breath, or weakness. Denies any side effects from medication and is content with current medication.   Hyperlipidemia Patient is coming in for recheck of his hyperlipidemia. The patient is currently taking atorvastatin. They deny any issues with myalgias or history of liver damage from it. They deny any focal numbness or weakness or chest pain.   GERD Patient is currently on omeprazole.  She denies any major symptoms or abdominal pain or belching or burping. She denies any blood in her stool or lightheadedness or dizziness.  als Patient is seeing neurology and has been diagnosed recently.    No diagnosis found.  Outpatient Encounter Medications as of 03/14/2019  Medication Sig  . albuterol (VENTOLIN HFA) 108 (90 Base) MCG/ACT inhaler Inhale 2 puffs into the lungs every 6 (six) hours as needed for wheezing or shortness of breath.  Marland Kitchen aspirin EC 81 MG tablet Take 81 mg by mouth daily.  Marland Kitchen atorvastatin (LIPITOR) 20 MG tablet Take 1  tablet by mouth once daily  . baclofen (LIORESAL) 10 MG tablet Take 1 tablet (10 mg total) by mouth 2 (two) times daily.  . benzocaine (BABY ORAJEL) 7.5 % oral gel Use as directed in the mouth or throat 3 (three) times daily as needed for pain.  . Cholecalciferol (VITAMIN D3) 5000 units TABS Take 5,000 Units by mouth daily.  Noelle Penner ALLERGY RELIEF, CETIRIZINE, 10 MG tablet Take 1 tablet by mouth once daily  . escitalopram (LEXAPRO) 10 MG tablet Take 1 tablet (10 mg total) by mouth daily.  . fluticasone (FLONASE) 50 MCG/ACT nasal spray Place 1 spray into both nostrils 2 (two) times daily as needed for allergies or rhinitis.  Marland Kitchen glycopyrrolate (ROBINUL) 1 MG tablet Take 1 mg by mouth 3 (three) times daily.  . hydrochlorothiazide (HYDRODIURIL) 25 MG tablet Take 0.5 tablets (12.5 mg total) by mouth daily.  . Multiple Vitamin (QUINTABS) TABS 1 tablet by Per G Tube route daily.  . naproxen (NAPROSYN) 500 MG tablet TAKE 1 TABLET BY MOUTH TWICE DAILY WITH A MEAL  . nitroGLYCERIN (NITROSTAT) 0.4 MG SL tablet Place 1 tablet (0.4 mg total) under the tongue every 5 (five) minutes as needed for chest pain.  Marland Kitchen omeprazole (PRILOSEC) 20 MG capsule Take 1 capsule by mouth once daily  . riluzole (RILUTEK) 50 MG tablet Take 50 mg by mouth 2 (two) times daily.  Marland Kitchen thiamine 100 MG tablet Take 100 mg by mouth. 03/07/19 Not picked up yet  . verapamil (CALAN-SR) 240 MG CR tablet Take 1 tablet (240 mg total) by mouth at bedtime.  . verapamil (VERELAN PM) 240 MG  24 hr capsule TAKE 1 CAPSULE BY MOUTH IN THE MORNING  . vitamin C (ASCORBIC ACID) 500 MG tablet Take 500 mg by mouth daily.   No facility-administered encounter medications on file as of 03/14/2019.    Review of Systems  Constitutional: Negative for chills and fever.  HENT: Positive for trouble swallowing.   Eyes: Negative for discharge.  Respiratory: Negative for shortness of breath and wheezing.   Cardiovascular: Negative for chest pain and leg swelling.    Musculoskeletal: Negative for back pain and gait problem.  Skin: Negative for rash.  Neurological: Positive for speech difficulty and weakness.  All other systems reviewed and are negative.   Observations/Objective: Patient sounds comfortable and in no acute distress  Assessment and Plan: Problem List Items Addressed This Visit      Cardiovascular and Mediastinum   Hypertension - Primary     Digestive   GERD (gastroesophageal reflux disease)     Other   Hyperlipidemia LDL goal <130       Follow up plan: Return in about 3 months (around 06/12/2019), or if symptoms worsen or fail to improve, for htn and gerd.     I discussed the assessment and treatment plan with the patient. The patient was provided an opportunity to ask questions and all were answered. The patient agreed with the plan and demonstrated an understanding of the instructions.   The patient was advised to call back or seek an in-person evaluation if the symptoms worsen or if the condition fails to improve as anticipated.  The above assessment and management plan was discussed with the patient. The patient verbalized understanding of and has agreed to the management plan. Patient is aware to call the clinic if symptoms persist or worsen. Patient is aware when to return to the clinic for a follow-up visit. Patient educated on when it is appropriate to go to the emergency department.    I provided 9 minutes of non-face-to-face time during this encounter.    Nils Pyle, MD

## 2019-03-16 ENCOUNTER — Other Ambulatory Visit: Payer: Self-pay | Admitting: Family Medicine

## 2019-03-17 DIAGNOSIS — G1221 Amyotrophic lateral sclerosis: Secondary | ICD-10-CM | POA: Diagnosis not present

## 2019-03-21 DIAGNOSIS — G1221 Amyotrophic lateral sclerosis: Secondary | ICD-10-CM | POA: Diagnosis not present

## 2019-03-23 ENCOUNTER — Other Ambulatory Visit: Payer: Self-pay | Admitting: Family Medicine

## 2019-03-25 DIAGNOSIS — G1221 Amyotrophic lateral sclerosis: Secondary | ICD-10-CM | POA: Diagnosis not present

## 2019-04-02 DIAGNOSIS — R9389 Abnormal findings on diagnostic imaging of other specified body structures: Secondary | ICD-10-CM | POA: Diagnosis not present

## 2019-04-02 DIAGNOSIS — E785 Hyperlipidemia, unspecified: Secondary | ICD-10-CM | POA: Diagnosis not present

## 2019-04-02 DIAGNOSIS — R0602 Shortness of breath: Secondary | ICD-10-CM | POA: Diagnosis not present

## 2019-04-02 DIAGNOSIS — Z79899 Other long term (current) drug therapy: Secondary | ICD-10-CM | POA: Diagnosis not present

## 2019-04-02 DIAGNOSIS — J189 Pneumonia, unspecified organism: Secondary | ICD-10-CM | POA: Diagnosis not present

## 2019-04-02 DIAGNOSIS — R079 Chest pain, unspecified: Secondary | ICD-10-CM | POA: Diagnosis not present

## 2019-04-02 DIAGNOSIS — J9811 Atelectasis: Secondary | ICD-10-CM | POA: Diagnosis not present

## 2019-04-02 DIAGNOSIS — I10 Essential (primary) hypertension: Secondary | ICD-10-CM | POA: Diagnosis not present

## 2019-04-02 DIAGNOSIS — L89152 Pressure ulcer of sacral region, stage 2: Secondary | ICD-10-CM | POA: Diagnosis not present

## 2019-04-02 DIAGNOSIS — J439 Emphysema, unspecified: Secondary | ICD-10-CM | POA: Diagnosis not present

## 2019-04-02 DIAGNOSIS — J9621 Acute and chronic respiratory failure with hypoxia: Secondary | ICD-10-CM | POA: Diagnosis not present

## 2019-04-02 DIAGNOSIS — G1221 Amyotrophic lateral sclerosis: Secondary | ICD-10-CM | POA: Diagnosis not present

## 2019-04-02 DIAGNOSIS — J9601 Acute respiratory failure with hypoxia: Secondary | ICD-10-CM | POA: Diagnosis not present

## 2019-04-02 DIAGNOSIS — R918 Other nonspecific abnormal finding of lung field: Secondary | ICD-10-CM | POA: Diagnosis not present

## 2019-04-02 DIAGNOSIS — Z7982 Long term (current) use of aspirin: Secondary | ICD-10-CM | POA: Diagnosis not present

## 2019-04-02 DIAGNOSIS — K219 Gastro-esophageal reflux disease without esophagitis: Secondary | ICD-10-CM | POA: Diagnosis not present

## 2019-04-02 DIAGNOSIS — J181 Lobar pneumonia, unspecified organism: Secondary | ICD-10-CM | POA: Diagnosis not present

## 2019-04-02 DIAGNOSIS — R64 Cachexia: Secondary | ICD-10-CM | POA: Diagnosis not present

## 2019-04-02 DIAGNOSIS — R1312 Dysphagia, oropharyngeal phase: Secondary | ICD-10-CM | POA: Diagnosis not present

## 2019-04-02 DIAGNOSIS — Z20822 Contact with and (suspected) exposure to covid-19: Secondary | ICD-10-CM | POA: Diagnosis not present

## 2019-04-02 DIAGNOSIS — T17590A Other foreign object in bronchus causing asphyxiation, initial encounter: Secondary | ICD-10-CM | POA: Diagnosis not present

## 2019-04-02 DIAGNOSIS — F17210 Nicotine dependence, cigarettes, uncomplicated: Secondary | ICD-10-CM | POA: Diagnosis not present

## 2019-04-02 DIAGNOSIS — J9809 Other diseases of bronchus, not elsewhere classified: Secondary | ICD-10-CM | POA: Diagnosis not present

## 2019-04-02 DIAGNOSIS — J690 Pneumonitis due to inhalation of food and vomit: Secondary | ICD-10-CM | POA: Diagnosis not present

## 2019-04-02 DIAGNOSIS — R599 Enlarged lymph nodes, unspecified: Secondary | ICD-10-CM | POA: Diagnosis not present

## 2019-04-02 DIAGNOSIS — F329 Major depressive disorder, single episode, unspecified: Secondary | ICD-10-CM | POA: Diagnosis not present

## 2019-04-02 DIAGNOSIS — G4733 Obstructive sleep apnea (adult) (pediatric): Secondary | ICD-10-CM | POA: Diagnosis not present

## 2019-04-02 DIAGNOSIS — E873 Alkalosis: Secondary | ICD-10-CM | POA: Diagnosis not present

## 2019-04-02 DIAGNOSIS — E43 Unspecified severe protein-calorie malnutrition: Secondary | ICD-10-CM | POA: Diagnosis not present

## 2019-04-08 DIAGNOSIS — R0902 Hypoxemia: Secondary | ICD-10-CM | POA: Diagnosis not present

## 2019-04-08 DIAGNOSIS — F17210 Nicotine dependence, cigarettes, uncomplicated: Secondary | ICD-10-CM | POA: Diagnosis not present

## 2019-04-08 DIAGNOSIS — J189 Pneumonia, unspecified organism: Secondary | ICD-10-CM | POA: Diagnosis not present

## 2019-04-08 DIAGNOSIS — R0789 Other chest pain: Secondary | ICD-10-CM | POA: Diagnosis not present

## 2019-04-08 DIAGNOSIS — R05 Cough: Secondary | ICD-10-CM | POA: Diagnosis not present

## 2019-04-08 DIAGNOSIS — G1221 Amyotrophic lateral sclerosis: Secondary | ICD-10-CM | POA: Diagnosis not present

## 2019-04-08 DIAGNOSIS — R0602 Shortness of breath: Secondary | ICD-10-CM | POA: Diagnosis not present

## 2019-04-08 DIAGNOSIS — I10 Essential (primary) hypertension: Secondary | ICD-10-CM | POA: Diagnosis not present

## 2019-04-08 DIAGNOSIS — R079 Chest pain, unspecified: Secondary | ICD-10-CM | POA: Diagnosis not present

## 2019-04-10 DIAGNOSIS — R0602 Shortness of breath: Secondary | ICD-10-CM | POA: Diagnosis not present

## 2019-04-10 DIAGNOSIS — I517 Cardiomegaly: Secondary | ICD-10-CM | POA: Diagnosis not present

## 2019-04-11 DIAGNOSIS — G1221 Amyotrophic lateral sclerosis: Secondary | ICD-10-CM | POA: Diagnosis not present

## 2019-04-17 DIAGNOSIS — G1221 Amyotrophic lateral sclerosis: Secondary | ICD-10-CM | POA: Diagnosis not present

## 2019-04-29 ENCOUNTER — Other Ambulatory Visit: Payer: Self-pay | Admitting: Family Medicine

## 2019-04-29 NOTE — Telephone Encounter (Signed)
Please advise when back in the office  

## 2019-05-01 MED ORDER — BACLOFEN 10 MG PO TABS: 10.0000 mg | ORAL_TABLET | Freq: Two times a day (BID) | ORAL | 0 refills | Status: AC

## 2019-05-03 DIAGNOSIS — J9811 Atelectasis: Secondary | ICD-10-CM | POA: Diagnosis not present

## 2019-05-03 DIAGNOSIS — R0602 Shortness of breath: Secondary | ICD-10-CM | POA: Diagnosis not present

## 2019-05-03 DIAGNOSIS — J690 Pneumonitis due to inhalation of food and vomit: Secondary | ICD-10-CM | POA: Diagnosis not present

## 2019-05-03 DIAGNOSIS — I10 Essential (primary) hypertension: Secondary | ICD-10-CM | POA: Diagnosis not present

## 2019-05-03 DIAGNOSIS — J9611 Chronic respiratory failure with hypoxia: Secondary | ICD-10-CM | POA: Diagnosis not present

## 2019-05-03 DIAGNOSIS — K219 Gastro-esophageal reflux disease without esophagitis: Secondary | ICD-10-CM | POA: Diagnosis not present

## 2019-05-03 DIAGNOSIS — R000 Tachycardia, unspecified: Secondary | ICD-10-CM | POA: Diagnosis not present

## 2019-05-03 DIAGNOSIS — J9612 Chronic respiratory failure with hypercapnia: Secondary | ICD-10-CM | POA: Diagnosis not present

## 2019-05-03 DIAGNOSIS — R1312 Dysphagia, oropharyngeal phase: Secondary | ICD-10-CM | POA: Diagnosis not present

## 2019-05-03 DIAGNOSIS — K5901 Slow transit constipation: Secondary | ICD-10-CM | POA: Diagnosis not present

## 2019-05-03 DIAGNOSIS — D72829 Elevated white blood cell count, unspecified: Secondary | ICD-10-CM | POA: Diagnosis not present

## 2019-05-03 DIAGNOSIS — R0600 Dyspnea, unspecified: Secondary | ICD-10-CM | POA: Diagnosis not present

## 2019-05-03 DIAGNOSIS — G1221 Amyotrophic lateral sclerosis: Secondary | ICD-10-CM | POA: Diagnosis not present

## 2019-05-03 DIAGNOSIS — L89152 Pressure ulcer of sacral region, stage 2: Secondary | ICD-10-CM | POA: Diagnosis not present

## 2019-05-03 DIAGNOSIS — K5900 Constipation, unspecified: Secondary | ICD-10-CM | POA: Diagnosis not present

## 2019-05-03 DIAGNOSIS — R0902 Hypoxemia: Secondary | ICD-10-CM | POA: Diagnosis not present

## 2019-05-03 DIAGNOSIS — R079 Chest pain, unspecified: Secondary | ICD-10-CM | POA: Diagnosis not present

## 2019-05-03 DIAGNOSIS — E785 Hyperlipidemia, unspecified: Secondary | ICD-10-CM | POA: Diagnosis not present

## 2019-05-03 DIAGNOSIS — Z9981 Dependence on supplemental oxygen: Secondary | ICD-10-CM | POA: Diagnosis not present

## 2019-05-03 DIAGNOSIS — Z9989 Dependence on other enabling machines and devices: Secondary | ICD-10-CM | POA: Diagnosis not present

## 2019-05-03 DIAGNOSIS — R471 Dysarthria and anarthria: Secondary | ICD-10-CM | POA: Diagnosis not present

## 2019-05-03 DIAGNOSIS — Z681 Body mass index (BMI) 19 or less, adult: Secondary | ICD-10-CM | POA: Diagnosis not present

## 2019-05-03 DIAGNOSIS — E860 Dehydration: Secondary | ICD-10-CM | POA: Diagnosis not present

## 2019-05-03 DIAGNOSIS — J9621 Acute and chronic respiratory failure with hypoxia: Secondary | ICD-10-CM | POA: Diagnosis not present

## 2019-05-03 DIAGNOSIS — G709 Myoneural disorder, unspecified: Secondary | ICD-10-CM | POA: Diagnosis not present

## 2019-05-03 DIAGNOSIS — Z515 Encounter for palliative care: Secondary | ICD-10-CM | POA: Diagnosis not present

## 2019-05-03 DIAGNOSIS — R1032 Left lower quadrant pain: Secondary | ICD-10-CM | POA: Diagnosis not present

## 2019-05-03 DIAGNOSIS — J9610 Chronic respiratory failure, unspecified whether with hypoxia or hypercapnia: Secondary | ICD-10-CM | POA: Diagnosis not present

## 2019-05-03 DIAGNOSIS — E871 Hypo-osmolality and hyponatremia: Secondary | ICD-10-CM | POA: Diagnosis not present

## 2019-05-04 DIAGNOSIS — E871 Hypo-osmolality and hyponatremia: Secondary | ICD-10-CM | POA: Diagnosis not present

## 2019-05-04 DIAGNOSIS — R0600 Dyspnea, unspecified: Secondary | ICD-10-CM | POA: Diagnosis not present

## 2019-05-04 DIAGNOSIS — J9612 Chronic respiratory failure with hypercapnia: Secondary | ICD-10-CM | POA: Diagnosis not present

## 2019-05-04 DIAGNOSIS — J690 Pneumonitis due to inhalation of food and vomit: Secondary | ICD-10-CM | POA: Diagnosis not present

## 2019-05-04 DIAGNOSIS — R000 Tachycardia, unspecified: Secondary | ICD-10-CM | POA: Diagnosis not present

## 2019-05-04 DIAGNOSIS — E860 Dehydration: Secondary | ICD-10-CM | POA: Diagnosis not present

## 2019-05-04 DIAGNOSIS — G709 Myoneural disorder, unspecified: Secondary | ICD-10-CM | POA: Diagnosis not present

## 2019-05-04 DIAGNOSIS — J9610 Chronic respiratory failure, unspecified whether with hypoxia or hypercapnia: Secondary | ICD-10-CM | POA: Diagnosis not present

## 2019-05-04 DIAGNOSIS — J9611 Chronic respiratory failure with hypoxia: Secondary | ICD-10-CM | POA: Diagnosis not present

## 2019-05-05 DIAGNOSIS — J690 Pneumonitis due to inhalation of food and vomit: Secondary | ICD-10-CM | POA: Diagnosis not present

## 2019-05-05 DIAGNOSIS — E860 Dehydration: Secondary | ICD-10-CM | POA: Diagnosis not present

## 2019-05-05 DIAGNOSIS — J9611 Chronic respiratory failure with hypoxia: Secondary | ICD-10-CM | POA: Diagnosis not present

## 2019-05-05 DIAGNOSIS — G1221 Amyotrophic lateral sclerosis: Secondary | ICD-10-CM | POA: Diagnosis not present

## 2019-05-05 DIAGNOSIS — R000 Tachycardia, unspecified: Secondary | ICD-10-CM | POA: Diagnosis not present

## 2019-05-05 DIAGNOSIS — E871 Hypo-osmolality and hyponatremia: Secondary | ICD-10-CM | POA: Diagnosis not present

## 2019-05-05 DIAGNOSIS — J9612 Chronic respiratory failure with hypercapnia: Secondary | ICD-10-CM | POA: Diagnosis not present

## 2019-05-05 DIAGNOSIS — Z9989 Dependence on other enabling machines and devices: Secondary | ICD-10-CM | POA: Diagnosis not present

## 2019-05-05 DIAGNOSIS — Z515 Encounter for palliative care: Secondary | ICD-10-CM | POA: Diagnosis not present

## 2019-05-05 DIAGNOSIS — R1312 Dysphagia, oropharyngeal phase: Secondary | ICD-10-CM | POA: Diagnosis not present

## 2019-05-05 DIAGNOSIS — K5900 Constipation, unspecified: Secondary | ICD-10-CM | POA: Diagnosis not present

## 2019-05-06 DIAGNOSIS — J9612 Chronic respiratory failure with hypercapnia: Secondary | ICD-10-CM | POA: Diagnosis not present

## 2019-05-06 DIAGNOSIS — Z9989 Dependence on other enabling machines and devices: Secondary | ICD-10-CM | POA: Diagnosis not present

## 2019-05-06 DIAGNOSIS — J690 Pneumonitis due to inhalation of food and vomit: Secondary | ICD-10-CM | POA: Diagnosis not present

## 2019-05-06 DIAGNOSIS — R000 Tachycardia, unspecified: Secondary | ICD-10-CM | POA: Diagnosis not present

## 2019-05-06 DIAGNOSIS — K5900 Constipation, unspecified: Secondary | ICD-10-CM | POA: Diagnosis not present

## 2019-05-06 DIAGNOSIS — E871 Hypo-osmolality and hyponatremia: Secondary | ICD-10-CM | POA: Diagnosis not present

## 2019-05-06 DIAGNOSIS — J9611 Chronic respiratory failure with hypoxia: Secondary | ICD-10-CM | POA: Diagnosis not present

## 2019-05-06 DIAGNOSIS — G1221 Amyotrophic lateral sclerosis: Secondary | ICD-10-CM | POA: Diagnosis not present

## 2019-05-07 DIAGNOSIS — Z9989 Dependence on other enabling machines and devices: Secondary | ICD-10-CM | POA: Diagnosis not present

## 2019-05-07 DIAGNOSIS — K5900 Constipation, unspecified: Secondary | ICD-10-CM | POA: Diagnosis not present

## 2019-05-07 DIAGNOSIS — J9611 Chronic respiratory failure with hypoxia: Secondary | ICD-10-CM | POA: Diagnosis not present

## 2019-05-07 DIAGNOSIS — R000 Tachycardia, unspecified: Secondary | ICD-10-CM | POA: Diagnosis not present

## 2019-05-07 DIAGNOSIS — J690 Pneumonitis due to inhalation of food and vomit: Secondary | ICD-10-CM | POA: Diagnosis not present

## 2019-05-07 DIAGNOSIS — E871 Hypo-osmolality and hyponatremia: Secondary | ICD-10-CM | POA: Diagnosis not present

## 2019-05-08 DIAGNOSIS — L89152 Pressure ulcer of sacral region, stage 2: Secondary | ICD-10-CM | POA: Diagnosis not present

## 2019-05-08 DIAGNOSIS — J9611 Chronic respiratory failure with hypoxia: Secondary | ICD-10-CM | POA: Diagnosis not present

## 2019-05-08 DIAGNOSIS — R000 Tachycardia, unspecified: Secondary | ICD-10-CM | POA: Diagnosis not present

## 2019-05-08 DIAGNOSIS — J690 Pneumonitis due to inhalation of food and vomit: Secondary | ICD-10-CM | POA: Diagnosis not present

## 2019-05-08 DIAGNOSIS — Z9989 Dependence on other enabling machines and devices: Secondary | ICD-10-CM | POA: Diagnosis not present

## 2019-05-09 DIAGNOSIS — W19XXXA Unspecified fall, initial encounter: Secondary | ICD-10-CM | POA: Diagnosis not present

## 2019-05-09 DIAGNOSIS — R079 Chest pain, unspecified: Secondary | ICD-10-CM | POA: Diagnosis not present

## 2019-05-09 DIAGNOSIS — R0789 Other chest pain: Secondary | ICD-10-CM | POA: Diagnosis not present

## 2019-05-11 DIAGNOSIS — R2689 Other abnormalities of gait and mobility: Secondary | ICD-10-CM | POA: Diagnosis not present

## 2019-05-11 DIAGNOSIS — G1221 Amyotrophic lateral sclerosis: Secondary | ICD-10-CM | POA: Diagnosis not present

## 2019-05-12 DIAGNOSIS — R221 Localized swelling, mass and lump, neck: Secondary | ICD-10-CM | POA: Diagnosis not present

## 2019-05-12 DIAGNOSIS — R0602 Shortness of breath: Secondary | ICD-10-CM | POA: Diagnosis not present

## 2019-05-12 DIAGNOSIS — I889 Nonspecific lymphadenitis, unspecified: Secondary | ICD-10-CM | POA: Diagnosis not present

## 2019-05-12 DIAGNOSIS — M542 Cervicalgia: Secondary | ICD-10-CM | POA: Diagnosis not present

## 2019-05-15 DIAGNOSIS — G1221 Amyotrophic lateral sclerosis: Secondary | ICD-10-CM | POA: Diagnosis not present

## 2019-05-15 DIAGNOSIS — T17990A Other foreign object in respiratory tract, part unspecified in causing asphyxiation, initial encounter: Secondary | ICD-10-CM | POA: Diagnosis not present

## 2019-05-15 DIAGNOSIS — R079 Chest pain, unspecified: Secondary | ICD-10-CM | POA: Diagnosis not present

## 2019-05-15 DIAGNOSIS — A419 Sepsis, unspecified organism: Secondary | ICD-10-CM | POA: Diagnosis not present

## 2019-05-15 DIAGNOSIS — I498 Other specified cardiac arrhythmias: Secondary | ICD-10-CM | POA: Diagnosis not present

## 2019-05-15 DIAGNOSIS — F17210 Nicotine dependence, cigarettes, uncomplicated: Secondary | ICD-10-CM | POA: Diagnosis not present

## 2019-05-15 DIAGNOSIS — R0602 Shortness of breath: Secondary | ICD-10-CM | POA: Diagnosis not present

## 2019-05-15 DIAGNOSIS — J9621 Acute and chronic respiratory failure with hypoxia: Secondary | ICD-10-CM | POA: Diagnosis not present

## 2019-05-15 DIAGNOSIS — J189 Pneumonia, unspecified organism: Secondary | ICD-10-CM | POA: Diagnosis not present

## 2019-05-15 DIAGNOSIS — R404 Transient alteration of awareness: Secondary | ICD-10-CM | POA: Diagnosis not present

## 2019-05-15 DIAGNOSIS — J9690 Respiratory failure, unspecified, unspecified whether with hypoxia or hypercapnia: Secondary | ICD-10-CM | POA: Diagnosis not present

## 2019-05-15 DIAGNOSIS — R069 Unspecified abnormalities of breathing: Secondary | ICD-10-CM | POA: Diagnosis not present

## 2019-05-15 DIAGNOSIS — Z20822 Contact with and (suspected) exposure to covid-19: Secondary | ICD-10-CM | POA: Diagnosis not present

## 2019-05-15 DIAGNOSIS — J188 Other pneumonia, unspecified organism: Secondary | ICD-10-CM | POA: Diagnosis not present

## 2019-05-16 DIAGNOSIS — E785 Hyperlipidemia, unspecified: Secondary | ICD-10-CM | POA: Diagnosis not present

## 2019-05-16 DIAGNOSIS — J9600 Acute respiratory failure, unspecified whether with hypoxia or hypercapnia: Secondary | ICD-10-CM | POA: Diagnosis not present

## 2019-05-16 DIAGNOSIS — G4700 Insomnia, unspecified: Secondary | ICD-10-CM | POA: Diagnosis not present

## 2019-05-16 DIAGNOSIS — F17210 Nicotine dependence, cigarettes, uncomplicated: Secondary | ICD-10-CM | POA: Diagnosis not present

## 2019-05-16 DIAGNOSIS — Z9911 Dependence on respirator [ventilator] status: Secondary | ICD-10-CM | POA: Diagnosis not present

## 2019-05-16 DIAGNOSIS — J9691 Respiratory failure, unspecified with hypoxia: Secondary | ICD-10-CM | POA: Diagnosis not present

## 2019-05-16 DIAGNOSIS — E860 Dehydration: Secondary | ICD-10-CM | POA: Diagnosis not present

## 2019-05-16 DIAGNOSIS — E43 Unspecified severe protein-calorie malnutrition: Secondary | ICD-10-CM | POA: Diagnosis not present

## 2019-05-16 DIAGNOSIS — J9622 Acute and chronic respiratory failure with hypercapnia: Secondary | ICD-10-CM | POA: Diagnosis not present

## 2019-05-16 DIAGNOSIS — J9621 Acute and chronic respiratory failure with hypoxia: Secondary | ICD-10-CM | POA: Diagnosis not present

## 2019-05-16 DIAGNOSIS — E873 Alkalosis: Secondary | ICD-10-CM | POA: Diagnosis not present

## 2019-05-16 DIAGNOSIS — I10 Essential (primary) hypertension: Secondary | ICD-10-CM | POA: Diagnosis not present

## 2019-05-16 DIAGNOSIS — F418 Other specified anxiety disorders: Secondary | ICD-10-CM | POA: Diagnosis not present

## 2019-05-16 DIAGNOSIS — L89890 Pressure ulcer of other site, unstageable: Secondary | ICD-10-CM | POA: Diagnosis not present

## 2019-05-16 DIAGNOSIS — K219 Gastro-esophageal reflux disease without esophagitis: Secondary | ICD-10-CM | POA: Diagnosis not present

## 2019-05-16 DIAGNOSIS — J690 Pneumonitis due to inhalation of food and vomit: Secondary | ICD-10-CM | POA: Diagnosis not present

## 2019-05-16 DIAGNOSIS — D649 Anemia, unspecified: Secondary | ICD-10-CM | POA: Diagnosis not present

## 2019-05-16 DIAGNOSIS — Z7189 Other specified counseling: Secondary | ICD-10-CM | POA: Diagnosis not present

## 2019-05-16 DIAGNOSIS — R079 Chest pain, unspecified: Secondary | ICD-10-CM | POA: Diagnosis not present

## 2019-05-16 DIAGNOSIS — R918 Other nonspecific abnormal finding of lung field: Secondary | ICD-10-CM | POA: Diagnosis not present

## 2019-05-16 DIAGNOSIS — J45909 Unspecified asthma, uncomplicated: Secondary | ICD-10-CM | POA: Diagnosis not present

## 2019-05-16 DIAGNOSIS — R0602 Shortness of breath: Secondary | ICD-10-CM | POA: Diagnosis not present

## 2019-05-16 DIAGNOSIS — Z515 Encounter for palliative care: Secondary | ICD-10-CM | POA: Diagnosis not present

## 2019-05-16 DIAGNOSIS — Z681 Body mass index (BMI) 19 or less, adult: Secondary | ICD-10-CM | POA: Diagnosis not present

## 2019-05-16 DIAGNOSIS — G1221 Amyotrophic lateral sclerosis: Secondary | ICD-10-CM | POA: Diagnosis not present

## 2019-05-16 DIAGNOSIS — J90 Pleural effusion, not elsewhere classified: Secondary | ICD-10-CM | POA: Diagnosis not present

## 2019-05-16 DIAGNOSIS — E871 Hypo-osmolality and hyponatremia: Secondary | ICD-10-CM | POA: Diagnosis not present

## 2019-05-16 DIAGNOSIS — J189 Pneumonia, unspecified organism: Secondary | ICD-10-CM | POA: Diagnosis not present

## 2019-05-16 DIAGNOSIS — Z931 Gastrostomy status: Secondary | ICD-10-CM | POA: Diagnosis not present

## 2019-05-16 DIAGNOSIS — R0902 Hypoxemia: Secondary | ICD-10-CM | POA: Diagnosis not present

## 2019-05-16 DIAGNOSIS — J9811 Atelectasis: Secondary | ICD-10-CM | POA: Diagnosis not present

## 2019-05-18 DIAGNOSIS — J9621 Acute and chronic respiratory failure with hypoxia: Secondary | ICD-10-CM | POA: Diagnosis not present

## 2019-05-18 DIAGNOSIS — G1221 Amyotrophic lateral sclerosis: Secondary | ICD-10-CM | POA: Diagnosis not present

## 2019-05-18 DIAGNOSIS — J90 Pleural effusion, not elsewhere classified: Secondary | ICD-10-CM | POA: Diagnosis not present

## 2019-05-18 DIAGNOSIS — J9622 Acute and chronic respiratory failure with hypercapnia: Secondary | ICD-10-CM | POA: Diagnosis not present

## 2019-05-20 DIAGNOSIS — J9622 Acute and chronic respiratory failure with hypercapnia: Secondary | ICD-10-CM | POA: Diagnosis not present

## 2019-05-20 DIAGNOSIS — J9621 Acute and chronic respiratory failure with hypoxia: Secondary | ICD-10-CM | POA: Diagnosis not present

## 2019-05-20 DIAGNOSIS — G1221 Amyotrophic lateral sclerosis: Secondary | ICD-10-CM | POA: Diagnosis not present

## 2019-05-20 DIAGNOSIS — F418 Other specified anxiety disorders: Secondary | ICD-10-CM | POA: Diagnosis not present

## 2019-05-20 DIAGNOSIS — Z515 Encounter for palliative care: Secondary | ICD-10-CM | POA: Diagnosis not present

## 2019-05-20 DIAGNOSIS — G4700 Insomnia, unspecified: Secondary | ICD-10-CM | POA: Diagnosis not present

## 2019-05-20 DIAGNOSIS — Z7189 Other specified counseling: Secondary | ICD-10-CM | POA: Diagnosis not present

## 2019-05-21 DIAGNOSIS — G1221 Amyotrophic lateral sclerosis: Secondary | ICD-10-CM | POA: Diagnosis not present

## 2019-05-21 DIAGNOSIS — J9811 Atelectasis: Secondary | ICD-10-CM | POA: Diagnosis not present

## 2019-05-21 DIAGNOSIS — J9622 Acute and chronic respiratory failure with hypercapnia: Secondary | ICD-10-CM | POA: Diagnosis not present

## 2019-05-21 DIAGNOSIS — F418 Other specified anxiety disorders: Secondary | ICD-10-CM | POA: Diagnosis not present

## 2019-05-21 DIAGNOSIS — J9621 Acute and chronic respiratory failure with hypoxia: Secondary | ICD-10-CM | POA: Diagnosis not present

## 2019-05-21 DIAGNOSIS — R918 Other nonspecific abnormal finding of lung field: Secondary | ICD-10-CM | POA: Diagnosis not present

## 2019-05-22 DIAGNOSIS — R918 Other nonspecific abnormal finding of lung field: Secondary | ICD-10-CM | POA: Diagnosis not present

## 2019-05-22 DIAGNOSIS — J9622 Acute and chronic respiratory failure with hypercapnia: Secondary | ICD-10-CM | POA: Diagnosis not present

## 2019-05-22 DIAGNOSIS — R0902 Hypoxemia: Secondary | ICD-10-CM | POA: Diagnosis not present

## 2019-05-22 DIAGNOSIS — J9621 Acute and chronic respiratory failure with hypoxia: Secondary | ICD-10-CM | POA: Diagnosis not present

## 2019-05-22 DIAGNOSIS — G1221 Amyotrophic lateral sclerosis: Secondary | ICD-10-CM | POA: Diagnosis not present

## 2019-05-22 DIAGNOSIS — F418 Other specified anxiety disorders: Secondary | ICD-10-CM | POA: Diagnosis not present

## 2019-05-23 DIAGNOSIS — G4700 Insomnia, unspecified: Secondary | ICD-10-CM | POA: Diagnosis not present

## 2019-05-23 DIAGNOSIS — Z931 Gastrostomy status: Secondary | ICD-10-CM | POA: Diagnosis not present

## 2019-05-23 DIAGNOSIS — Z515 Encounter for palliative care: Secondary | ICD-10-CM | POA: Diagnosis not present

## 2019-05-23 DIAGNOSIS — G1221 Amyotrophic lateral sclerosis: Secondary | ICD-10-CM | POA: Diagnosis not present

## 2019-05-23 DIAGNOSIS — J9691 Respiratory failure, unspecified with hypoxia: Secondary | ICD-10-CM | POA: Diagnosis not present

## 2019-05-23 DIAGNOSIS — Z7189 Other specified counseling: Secondary | ICD-10-CM | POA: Diagnosis not present

## 2019-05-24 DIAGNOSIS — Z931 Gastrostomy status: Secondary | ICD-10-CM | POA: Diagnosis not present

## 2019-05-24 DIAGNOSIS — G1221 Amyotrophic lateral sclerosis: Secondary | ICD-10-CM | POA: Diagnosis not present

## 2019-05-24 DIAGNOSIS — J9691 Respiratory failure, unspecified with hypoxia: Secondary | ICD-10-CM | POA: Diagnosis not present

## 2019-05-25 DIAGNOSIS — J9621 Acute and chronic respiratory failure with hypoxia: Secondary | ICD-10-CM | POA: Diagnosis not present

## 2019-05-25 DIAGNOSIS — L89890 Pressure ulcer of other site, unstageable: Secondary | ICD-10-CM | POA: Diagnosis not present

## 2019-05-25 DIAGNOSIS — Z931 Gastrostomy status: Secondary | ICD-10-CM | POA: Diagnosis not present

## 2019-05-25 DIAGNOSIS — J9622 Acute and chronic respiratory failure with hypercapnia: Secondary | ICD-10-CM | POA: Diagnosis not present

## 2019-05-25 DIAGNOSIS — G1221 Amyotrophic lateral sclerosis: Secondary | ICD-10-CM | POA: Diagnosis not present

## 2019-05-27 DIAGNOSIS — G1221 Amyotrophic lateral sclerosis: Secondary | ICD-10-CM | POA: Diagnosis not present

## 2019-06-23 DEATH — deceased

## 2020-12-10 IMAGING — CT CT HEAD WITHOUT CONTRAST
3 of 11 series · 16 of 47 positions shown, 18 images · non-contrast
Comparison: No priors.

CLINICAL DATA: 53-year-old male with history of trauma from a fall
while trimming some trees.

EXAM:
CT HEAD WITHOUT CONTRAST
CT MAXILLOFACIAL WITHOUT CONTRAST
CT CERVICAL SPINE WITHOUT CONTRAST
TECHNIQUE: Multidetector CT imaging of the head, cervical spine, and
maxillofacial structures were performed using the standard protocol
without intravenous contrast. Multiplanar CT image reconstructions
of the cervical spine and maxillofacial structures were also
generated.

[Series 7: max soft · axial · 0.34mm/px · z∈[+48,+162]mm · 6 of 81 slices shown]
[im 12/81  brain]
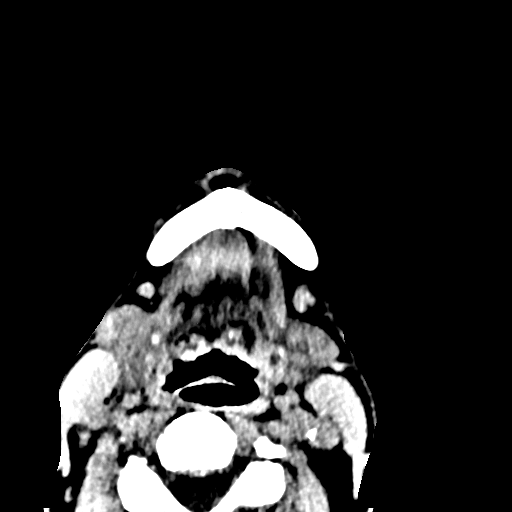
[im 23/81  brain]
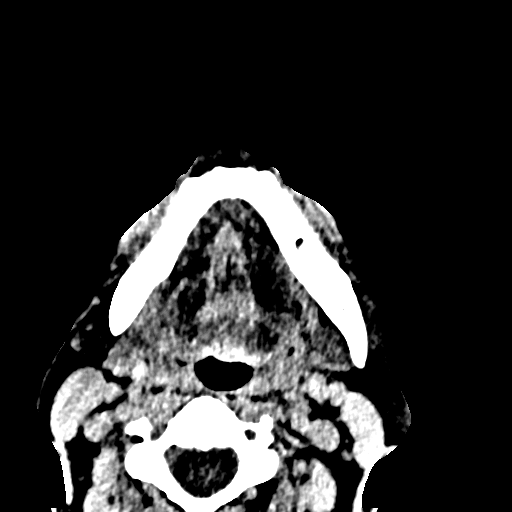
[im 35/81  brain]
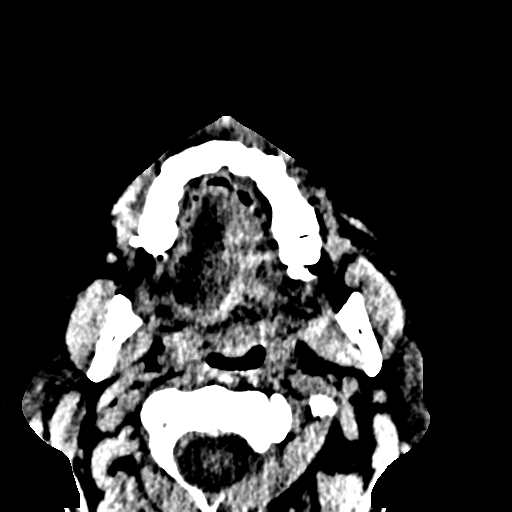
[im 46/81  brain]
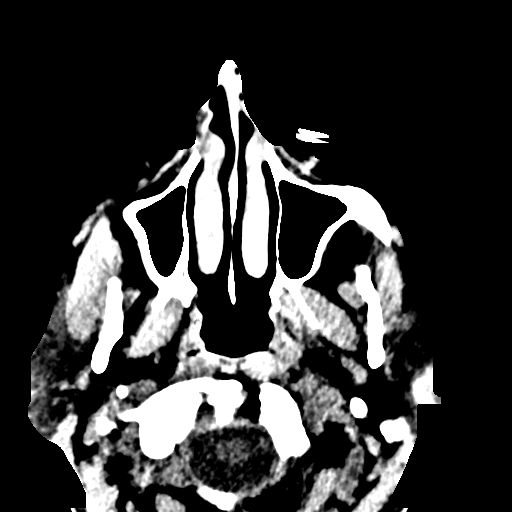
[im 58/81  brain]
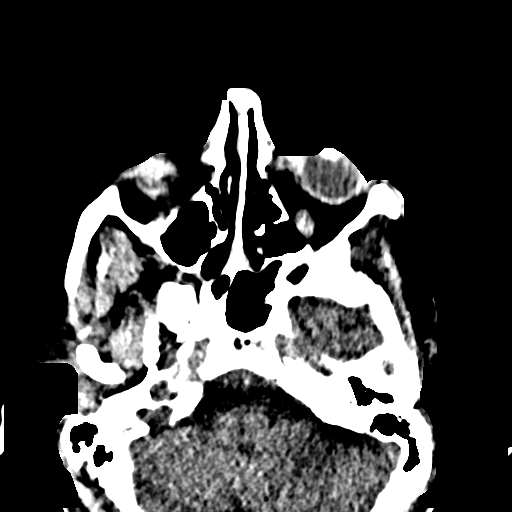
[im 69/81  brain]
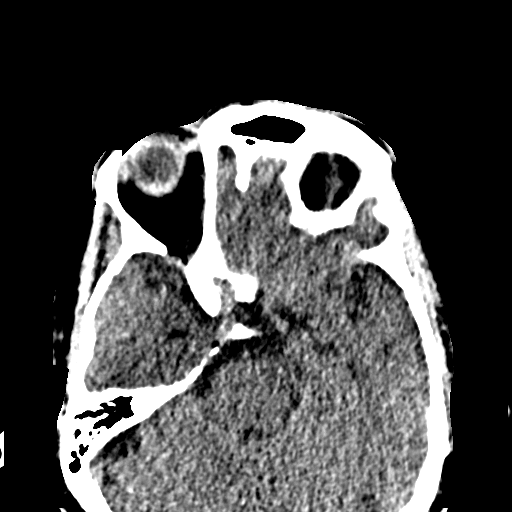

[Series 11: coronal soft · coronal · 0.34mm/px · 3 of 75 slices shown]
[im 19/75  brain]
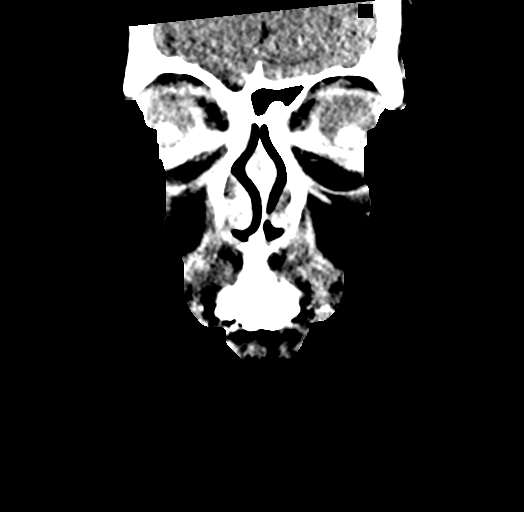
[im 38/75  brain]
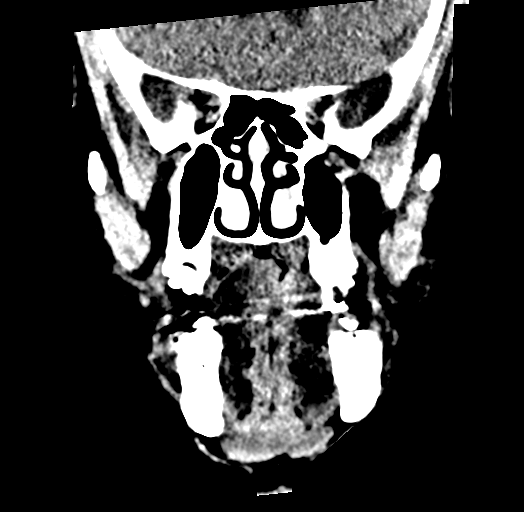
[im 56/75  brain]
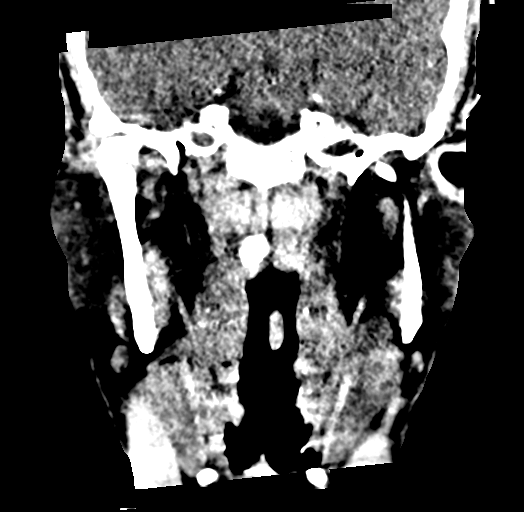

[Series 19: orthogonal axials · axial · 0.21mm/px · z∈[-23,+104]mm · 7 of 88 slices shown, 9 images]
[im 11/88  brain]
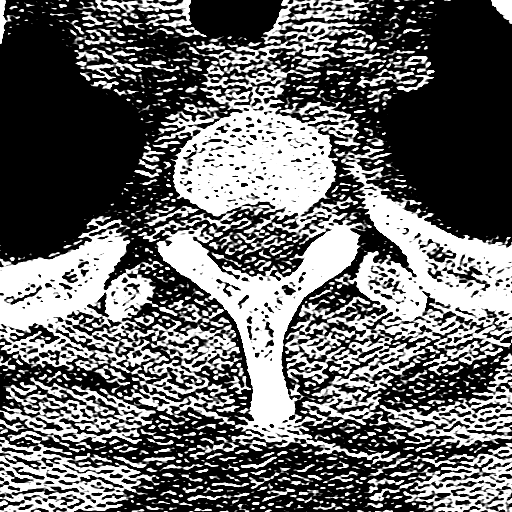
[im 11/88  bone]
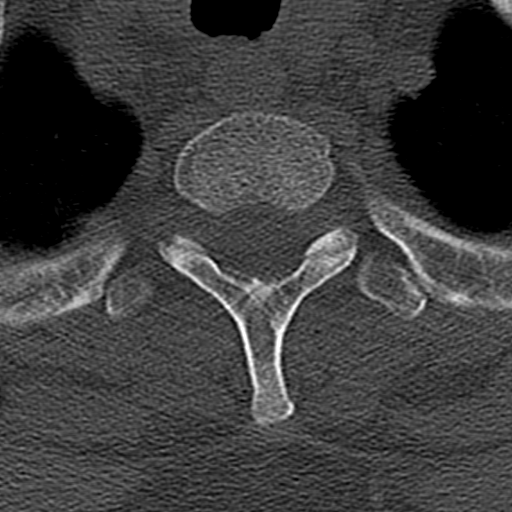
[im 22/88  brain]
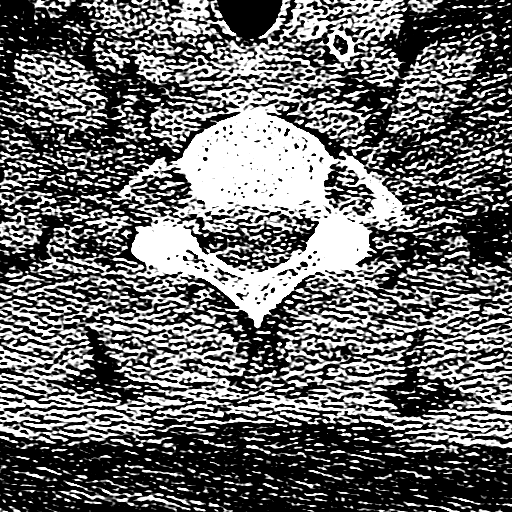
[im 33/88  brain]
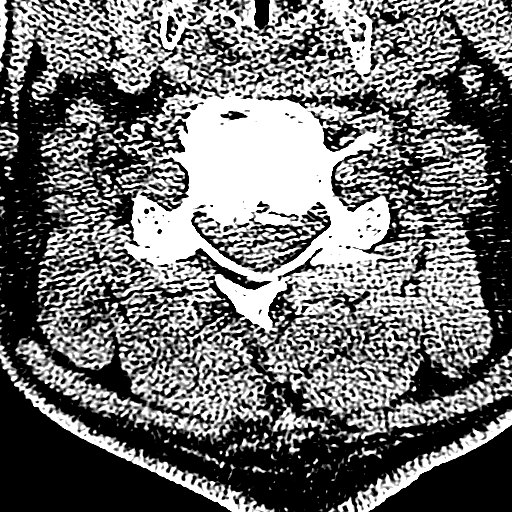
[im 44/88  brain]
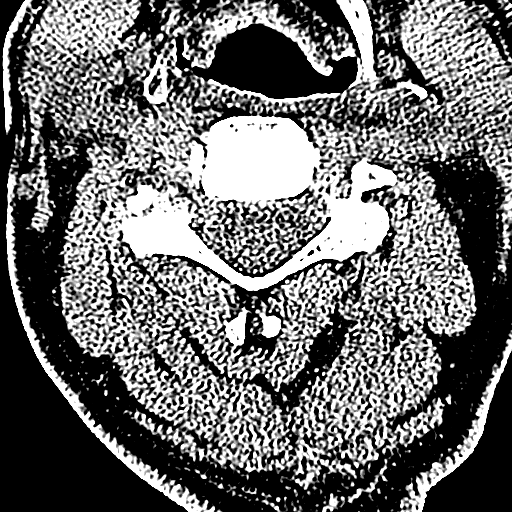
[im 55/88  brain]
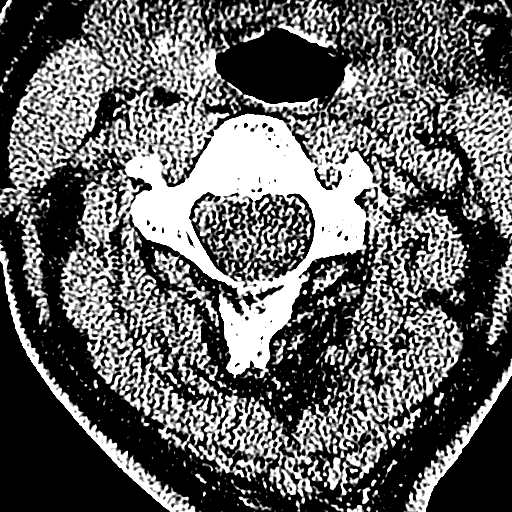
[im 55/88  bone]
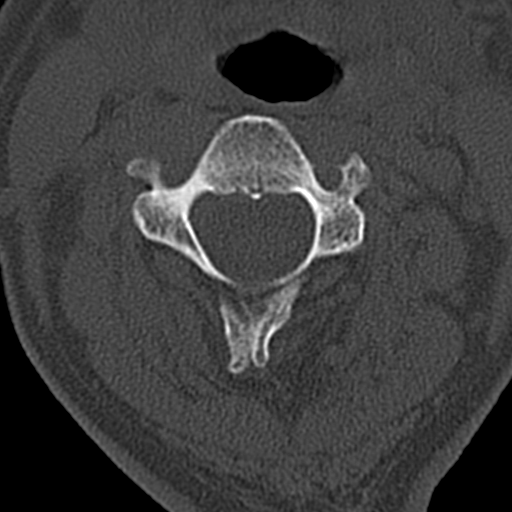
[im 66/88  brain]
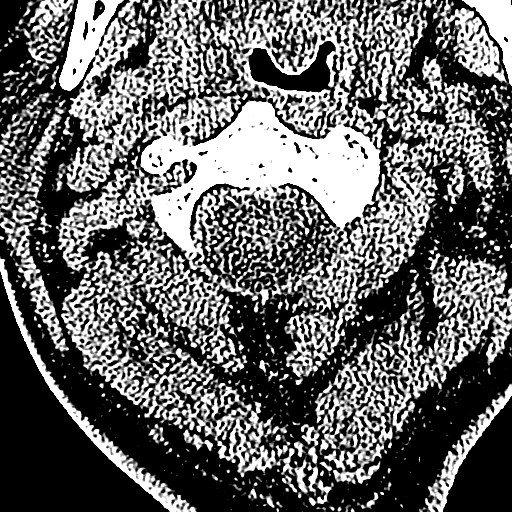
[im 77/88  brain]
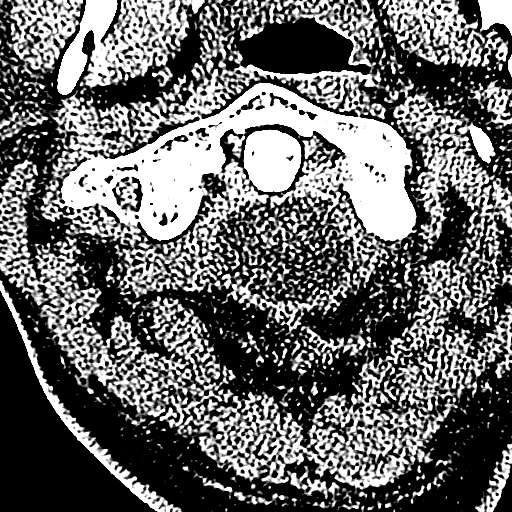

[16 of 47 positions shown; findings below may reference images not displayed]

FINDINGS: CT HEAD FINDINGS

Brain: No evidence of acute infarction, hemorrhage, hydrocephalus,
extra-axial collection or mass lesion/mass effect.

Vascular: No hyperdense vessel or unexpected calcification.

Skull: Normal. Negative for fracture or focal lesion.

Other: None.

CT MAXILLOFACIAL FINDINGS

Osseous: No fracture or mandibular dislocation. No destructive
process.

Orbits: Negative. No traumatic or inflammatory finding.

Sinuses: Clear.

Soft tissues: Negative.

CT CERVICAL SPINE FINDINGS

Alignment: Normal.

Skull base and vertebrae: No acute fracture. No primary bone lesion
or focal pathologic process.

Soft tissues and spinal canal: No prevertebral fluid or swelling. No
visible canal hematoma.

Disc levels: Multilevel degenerative disc disease, most severe at
C5-C6 and C6-C7. Moderate multilevel facet arthropathy.

Upper chest: Negative.

Other: None.
IMPRESSION: 1. No evidence of significant acute traumatic injury to the skull,
brain, facial bones or cervical spine.
2. Normal appearance of the brain.
3. Multilevel degenerative disc disease and cervical spondylosis.
